# Patient Record
Sex: Male | Born: 1965 | Race: White | Hispanic: No | Marital: Single | Smoking: Current every day smoker
Health system: Southern US, Community
[De-identification: ages and names within clinical notes are randomized; demographics above are authoritative.]

## PROBLEM LIST (undated history)

## (undated) DIAGNOSIS — E119 Type 2 diabetes mellitus without complications: Secondary | ICD-10-CM

## (undated) DIAGNOSIS — H548 Legal blindness, as defined in USA: Secondary | ICD-10-CM

## (undated) DIAGNOSIS — I10 Essential (primary) hypertension: Secondary | ICD-10-CM

---

## 2004-01-10 ENCOUNTER — Observation Stay (HOSPITAL_COMMUNITY): Admission: AC | Admit: 2004-01-10 | Discharge: 2004-01-11 | Payer: Self-pay

## 2004-01-10 ENCOUNTER — Encounter (INDEPENDENT_AMBULATORY_CARE_PROVIDER_SITE_OTHER): Payer: Self-pay | Admitting: *Deleted

## 2007-04-24 ENCOUNTER — Emergency Department (HOSPITAL_COMMUNITY): Admission: EM | Admit: 2007-04-24 | Discharge: 2007-04-24 | Payer: Self-pay | Admitting: Emergency Medicine

## 2013-01-10 ENCOUNTER — Emergency Department (HOSPITAL_COMMUNITY)
Admission: EM | Admit: 2013-01-10 | Discharge: 2013-01-10 | Disposition: A | Discharge status: Home / Self Care | Payer: Medicare Other | Source: Home / Self Care | Attending: Family Medicine | Admitting: Family Medicine

## 2013-01-10 ENCOUNTER — Encounter (HOSPITAL_COMMUNITY): Payer: Self-pay | Admitting: Emergency Medicine

## 2013-01-10 DIAGNOSIS — H109 Unspecified conjunctivitis: Secondary | ICD-10-CM

## 2013-01-10 DIAGNOSIS — I10 Essential (primary) hypertension: Secondary | ICD-10-CM

## 2013-01-10 DIAGNOSIS — J309 Allergic rhinitis, unspecified: Secondary | ICD-10-CM

## 2013-01-10 HISTORY — DX: Essential (primary) hypertension: I10

## 2013-01-10 HISTORY — DX: Legal blindness, as defined in USA: H54.8

## 2013-01-10 MED ORDER — ERYTHROMYCIN 5 MG/GM OP OINT: TOPICAL_OINTMENT | OPHTHALMIC | Status: AC

## 2013-01-10 MED ORDER — FLUTICASONE PROPIONATE 50 MCG/ACT NA SUSP: 2.0000 | Freq: Every day | NASAL | Status: AC

## 2013-01-10 MED ORDER — AZELASTINE HCL 0.05 % OP SOLN: 1.0000 [drp] | Freq: Two times a day (BID) | OPHTHALMIC | Status: AC

## 2013-01-10 MED ORDER — FEXOFENADINE HCL 180 MG PO TABS: 180.0000 mg | ORAL_TABLET | Freq: Every day | ORAL | Status: AC | PRN

## 2013-01-10 MED ORDER — IBUPROFEN 600 MG PO TABS: 600.0000 mg | ORAL_TABLET | Freq: Three times a day (TID) | ORAL | Status: DC | PRN

## 2013-01-10 MED ORDER — TRIAMTERENE-HCTZ 37.5-25 MG PO TABS: 1.0000 | ORAL_TABLET | Freq: Every day | ORAL | Status: AC

## 2013-01-10 MED ORDER — GUAIFENESIN-CODEINE 100-10 MG/5ML PO SYRP: 5.0000 mL | ORAL_SOLUTION | Freq: Three times a day (TID) | ORAL | Status: AC | PRN

## 2013-01-10 NOTE — ED Provider Notes (Signed)
History     CSN: [id number]  Arrival date & time 01/10/13  1010   First MD Initiated Contact with Patient 01/10/13 1103      Chief Complaint  Patient presents with  . URI    (Consider location/radiation/quality/duration/timing/severity/associated sxs/prior treatment) HPI Comments: 47 year old male legally blind also with history of hypertension. Here complaining of nasal congestion, right ear discomfort, watery eyes, clear rhinorrhea and sore throat for 4 days. No fever or chills but reports general malaise. Eyes were initially itching and patient has been dropping recurrently but now there is burning sensation and white drainage bilaterally. Patient is a smoker and denies cough, shortness of breath, chest pain or wheezing.   Past Medical History  Diagnosis Date  . Hypertension   . Legally blind     History reviewed. No pertinent past surgical history.  History reviewed. No pertinent family history.  History  Substance Use Topics  . Smoking status: Current Every Day Smoker  . Smokeless tobacco: Not on file  . Alcohol Use: Yes      Review of Systems  Constitutional: Negative for fever, chills, diaphoresis, appetite change and fatigue.  HENT: Positive for congestion, sore throat, rhinorrhea, sneezing and postnasal drip. Negative for trouble swallowing, sinus pressure and ear discharge.   Eyes: Positive for pain, discharge, redness and itching.  Respiratory: Negative for cough, chest tightness, shortness of breath and wheezing.   Cardiovascular: Negative for chest pain.  Gastrointestinal: Negative for nausea, vomiting, abdominal pain and diarrhea.  Genitourinary: Negative for dysuria.  Skin: Negative for rash.  Allergic/Immunologic: Positive for environmental allergies.  Neurological: Negative for dizziness and headaches.  All other systems reviewed and are negative.    Allergies  Review of patient's allergies indicates not on file.  Home Medications   Current  Outpatient Rx  Name  Route  Sig  Dispense  Refill  . OVER THE COUNTER MEDICATION      Patient has been off medicine for 6 months, ran out         . azelastine (OPTIVAR) 0.05 % ophthalmic solution   Both Eyes   Place 1 drop into both eyes 2 (two) times daily.   6 mL   0   . erythromycin ophthalmic ointment      Place a 1/2 inch ribbon of ointment into the lower conjunctiva bilateral tid for 7 days.   3.5 g   0   . fexofenadine (ALLEGRA) 180 MG tablet   Oral   Take 1 tablet (180 mg total) by mouth daily as needed.   30 tablet   0   . fluticasone (FLONASE) 50 MCG/ACT nasal spray   Nasal   Place 2 sprays into the nose daily.   16 g   0   . guaiFENesin-codeine (ROBITUSSIN AC) 100-10 MG/5ML syrup   Oral   Take 5 mLs by mouth 3 (three) times daily as needed for cough.   120 mL   0   . ibuprofen (ADVIL,MOTRIN) 600 MG tablet   Oral   Take 1 tablet (600 mg total) by mouth every 8 (eight) hours as needed for pain or fever.   20 tablet   0   . triamterene-hydrochlorothiazide (MAXZIDE-25) 37.5-25 MG per tablet   Oral   Take 1 tablet by mouth daily.   30 tablet   1     BP 143/103  Pulse 82  Temp(Src) 98.2 F (36.8 C) (Oral)  Resp 18  SpO2 96%  Physical Exam  Nursing note and vitals  reviewed. Constitutional: He is oriented to person, place, and time. He appears well-developed and well-nourished. No distress.  HENT:  Head: Normocephalic and atraumatic.  Right Ear: External ear normal.  Left Ear: External ear normal.  Mouth/Throat: No oropharyngeal exudate.  Nasal Congestion with erythema and swelling of nasal turbinates, clear rhinorrhea. Significant pharyngeal erythema no exudates. No uvula deviation. No trismus. TM's with increased vascular markings and some dullness bilaterally no swelling or bulging  Eyes: EOM are normal. Pupils are equal, round, and reactive to light. Right eye exhibits discharge. Left eye exhibits discharge. No scleral icterus.   Conjunctival erythema with white/yellow exudates and mild blepharitis bilaterally. No periorbital erythema, induration, tenderness or edema.  Neck: Neck supple.  Cardiovascular: Normal rate, normal heart sounds and intact distal pulses.   Pulmonary/Chest: Effort normal and breath sounds normal. No respiratory distress. He has no wheezes. He has no rales. He exhibits no tenderness.  Abdominal: Soft. There is no tenderness.  Lymphadenopathy:    He has no cervical adenopathy.  Neurological: He is alert and oriented to person, place, and time.  Skin: No rash noted. He is not diaphoretic.    ED Course  Procedures (including critical care time)  Labs Reviewed  POCT RAPID STREP A ([facility] ONLY)   No results found.   1. Conjunctivitis   2. Rhinitis, allergic   3. Hypertension       MDM  #1 impress allergic conjunctivitis  Initially now with signs of over infection.treated with erythromycin ointment and Optivar ophthalmic. #2 impress allergic rhinitis possible viral upper respiratory concomitant infection, treated with Flonase, fexofenadine, ibuprofen, guaifenesin/codeine. #3 contacted patient's pharmacy to confirm hydrochlorothiazide dose decided to prescribe Maxzide. Patient declined baseline blood work today. Patient was given contact information for [facility] to establish primary care. Supportive care and red flags should prompt his return to medical attention discussed with patient and his caretaker and provided in writing.        Sharin Grave, MD 01/11/13 [phone]

## 2013-01-10 NOTE — ED Notes (Signed)
Multiple concerns: dont feel good, sore throat, eyes per information paper

## 2014-10-20 ENCOUNTER — Encounter (HOSPITAL_COMMUNITY): Payer: Self-pay | Admitting: Emergency Medicine

## 2014-10-20 ENCOUNTER — Emergency Department (HOSPITAL_COMMUNITY)
Admission: EM | Admit: 2014-10-20 | Discharge: 2014-10-20 | Disposition: A | Discharge status: Home / Self Care | Payer: 59 | Source: Home / Self Care | Attending: Family Medicine | Admitting: Family Medicine

## 2014-10-20 DIAGNOSIS — L729 Follicular cyst of the skin and subcutaneous tissue, unspecified: Secondary | ICD-10-CM

## 2014-10-20 NOTE — ED Provider Notes (Signed)
CSN: [id number]     Arrival date & time 10/20/14  1147 History   First MD Initiated Contact with Patient 10/20/14 1227     No chief complaint on file.  (Consider location/radiation/quality/duration/timing/severity/associated sxs/prior Treatment) HPI        49 year old male presents for evaluation of a skin cyst. It has been there for about 3 years on the back of his neck below the hairline. It has changed or gotten much larger in the past 3 weeks. He denies any other symptoms. There is no pain associated with this. He does not have a primary care doctor.  Past Medical History  Diagnosis Date  . Hypertension   . Legally blind    No past surgical history on file. No family history on file. History  Substance Use Topics  . Smoking status: Current Every Day Smoker  . Smokeless tobacco: Not on file  . Alcohol Use: Yes    Review of Systems  Skin:       Cyst on back of neck, see history of present illness  All other systems reviewed and are negative.   Allergies  Review of patient's allergies indicates not on file.  Home Medications   Prior to Admission medications   Medication Sig Start Date End Date Taking? Authorizing Provider  azelastine (OPTIVAR) 0.05 % ophthalmic solution Place 1 drop into both eyes 2 (two) times daily. 01/10/13   Adlih Moreno-Coll, MD  erythromycin ophthalmic ointment Place a 1/2 inch ribbon of ointment into the lower conjunctiva bilateral tid for 7 days. 01/10/13   Adlih Moreno-Coll, MD  fexofenadine (ALLEGRA) 180 MG tablet Take 1 tablet (180 mg total) by mouth daily as needed. 01/10/13   Adlih Moreno-Coll, MD  fluticasone (FLONASE) 50 MCG/ACT nasal spray Place 2 sprays into the nose daily. 01/10/13   Adlih Moreno-Coll, MD  guaiFENesin-codeine (ROBITUSSIN AC) 100-10 MG/5ML syrup Take 5 mLs by mouth 3 (three) times daily as needed for cough. 01/10/13   Adlih Moreno-Coll, MD  ibuprofen (ADVIL,MOTRIN) 600 MG tablet Take 1 tablet (600 mg total) by mouth every 8  (eight) hours as needed for pain or fever. 01/10/13   Sharin GraveAdlih Moreno-Coll, MD  OVER THE COUNTER MEDICATION Patient has been off medicine for 6 months, ran out    Historical Provider, MD  triamterene-hydrochlorothiazide (MAXZIDE-25) 37.5-25 MG per tablet Take 1 tablet by mouth daily. 01/10/13   Adlih Moreno-Coll, MD   BP 136/84 mmHg  Pulse 61  Temp(Src) 98.4 F (36.9 C) (Oral)  Resp 16  SpO2 100% Physical Exam  Constitutional: He is oriented to person, place, and time. He appears well-developed and well-nourished. No distress.  HENT:  Head: Normocephalic.  Pulmonary/Chest: Effort normal. No respiratory distress.  Neurological: He is alert and oriented to person, place, and time. Coordination normal.  Skin: Skin is warm and dry. No rash noted. He is not diaphoretic.  On the back of the neck, there is a 4 cm diameter indurated nontender lesion, mobile  Psychiatric: He has a normal mood and affect. Judgment normal.  Nursing note and vitals reviewed.   ED Course  Procedures (including critical care time) Labs Review Labs Reviewed - No data to display  Imaging Review No results found.   MDM   1. Skin cyst    He does not have a primary care doctor, he does have health insurance. He is referred to general surgery for evaluation and management    Graylon GoodZachary H Jomes Giraldo, PA-C 10/20/14 1241

## 2014-10-20 NOTE — ED Notes (Signed)
Pt states that he has a mass/lump on neck for 2 weeks.

## 2014-10-20 NOTE — Discharge Instructions (Signed)
Epidermal Cyst An epidermal cyst is sometimes called a sebaceous cyst, epidermal inclusion cyst, or infundibular cyst. These cysts usually contain a substance that looks "pasty" or "cheesy" and may have a bad smell. This substance is a protein called keratin. Epidermal cysts are usually found on the face, neck, or trunk. They may also occur in the vaginal area or other parts of the genitalia of both men and women. Epidermal cysts are usually small, painless, slow-growing bumps or lumps that move freely under the skin. It is important not to try to pop them. This may cause an infection and lead to tenderness and swelling. CAUSES  Epidermal cysts may be caused by a deep penetrating injury to the skin or a plugged hair follicle, often associated with acne. SYMPTOMS  Epidermal cysts can become inflamed and cause:  Redness.  Tenderness.  Increased temperature of the skin over the bumps or lumps.  Grayish-white, bad smelling material that drains from the bump or lump. DIAGNOSIS  Epidermal cysts are easily diagnosed by your caregiver during an exam. Rarely, a tissue sample (biopsy) may be taken to rule out other conditions that may resemble epidermal cysts. TREATMENT   Epidermal cysts often get better and disappear on their own. They are rarely ever cancerous.  If a cyst becomes infected, it may become inflamed and tender. This may require opening and draining the cyst. Treatment with antibiotics may be necessary. When the infection is gone, the cyst may be removed with minor surgery.  Small, inflamed cysts can often be treated with antibiotics or by injecting steroid medicines.  Sometimes, epidermal cysts become large and bothersome. If this happens, surgical removal in your caregiver's office may be necessary. HOME CARE INSTRUCTIONS  Only take over-the-counter or prescription medicines as directed by your caregiver.  Take your antibiotics as directed. Finish them even if you start to feel  better. SEEK MEDICAL CARE IF:   Your cyst becomes tender, red, or swollen.  Your condition is not improving or is getting worse.  You have any other questions or concerns. MAKE SURE YOU:  Understand these instructions.  Will watch your condition.  Will get help right away if you are not doing well or get worse. Document Released: 07/17/2004 Document Revised: 11/08/2011 Document Reviewed: 02/22/2011 Exit[employer] Patient Information 2015 [employer]. This information is not intended to replace advice given to you by your health care provider. Make sure you discuss any questions you have with your health care provider.  

## 2015-08-07 ENCOUNTER — Ambulatory Visit (INDEPENDENT_AMBULATORY_CARE_PROVIDER_SITE_OTHER): Payer: Self-pay | Admitting: Emergency Medicine

## 2015-08-07 ENCOUNTER — Emergency Department (HOSPITAL_COMMUNITY)
Admission: EM | Admit: 2015-08-07 | Discharge: 2015-08-07 | Disposition: A | Discharge status: Home / Self Care | Payer: Medicare Other | Attending: Emergency Medicine | Admitting: Emergency Medicine

## 2015-08-07 ENCOUNTER — Emergency Department (HOSPITAL_COMMUNITY): Payer: Medicare Other

## 2015-08-07 VITALS — BP 192/92 | HR 72 | Temp 98.0°F | Resp 16 | Ht 74.5 in | Wt 223.2 lb

## 2015-08-07 DIAGNOSIS — R079 Chest pain, unspecified: Secondary | ICD-10-CM | POA: Diagnosis not present

## 2015-08-07 DIAGNOSIS — H548 Legal blindness, as defined in USA: Secondary | ICD-10-CM | POA: Diagnosis not present

## 2015-08-07 DIAGNOSIS — E119 Type 2 diabetes mellitus without complications: Secondary | ICD-10-CM

## 2015-08-07 DIAGNOSIS — F17200 Nicotine dependence, unspecified, uncomplicated: Secondary | ICD-10-CM | POA: Diagnosis not present

## 2015-08-07 DIAGNOSIS — Z791 Long term (current) use of non-steroidal anti-inflammatories (NSAID): Secondary | ICD-10-CM | POA: Diagnosis not present

## 2015-08-07 DIAGNOSIS — I10 Essential (primary) hypertension: Secondary | ICD-10-CM | POA: Diagnosis not present

## 2015-08-07 DIAGNOSIS — R0789 Other chest pain: Secondary | ICD-10-CM | POA: Insufficient documentation

## 2015-08-07 DIAGNOSIS — F17210 Nicotine dependence, cigarettes, uncomplicated: Secondary | ICD-10-CM | POA: Diagnosis not present

## 2015-08-07 LAB — BASIC METABOLIC PANEL
ANION GAP: 6 (ref 5–15)
BUN: 18 mg/dL (ref 6–20)
CALCIUM: 8.9 mg/dL (ref 8.9–10.3)
CO2: 25 mmol/L (ref 22–32)
CREATININE: 1.01 mg/dL (ref 0.61–1.24)
Chloride: 106 mmol/L (ref 101–111)
GFR calc Af Amer: >60 mL/min (ref >60)
GFR calc non Af Amer: >60 mL/min (ref >60)
GLUCOSE: 126 mg/dL — AB (ref 65–99)
Potassium: 4.6 mmol/L (ref 3.5–5.1)
Sodium: 137 mmol/L (ref 135–145)

## 2015-08-07 LAB — CBC
HEMATOCRIT: 40.2 % (ref 39.0–52.0)
Hemoglobin: 13.6 g/dL (ref 13.0–17.0)
MCH: 30.8 pg (ref 26.0–34.0)
MCHC: 33.8 g/dL (ref 30.0–36.0)
MCV: 91 fL (ref 78.0–100.0)
Platelets: 224 10*3/uL (ref 150–400)
RBC: 4.42 MIL/uL (ref 4.22–5.81)
RDW: 13.3 % (ref 11.5–15.5)
WBC: 12.7 10*3/uL — ABNORMAL HIGH (ref 4.0–10.5)

## 2015-08-07 LAB — I-STAT TROPONIN, ED: Troponin i, poc: 0 ng/mL (ref 0.00–0.08)

## 2015-08-07 MED ORDER — NITROGLYCERIN 0.3 MG SL SUBL: 0.3000 mg | SUBLINGUAL_TABLET | SUBLINGUAL | Status: AC | PRN

## 2015-08-07 MED ORDER — IBUPROFEN 800 MG PO TABS
800.0000 mg | ORAL_TABLET | Freq: Once | ORAL | Status: AC
  Administered 2015-08-07: 800 mg via ORAL
  Filled 2015-08-07: qty 1

## 2015-08-07 MED ORDER — IBUPROFEN 800 MG PO TABS: 800.0000 mg | ORAL_TABLET | Freq: Three times a day (TID) | ORAL | Status: AC

## 2015-08-07 MED ORDER — SODIUM CHLORIDE 0.9 % IV SOLN
1000.0000 mL | INTRAVENOUS | Status: DC
  Administered 2015-08-07: 1000 mL via INTRAVENOUS

## 2015-08-07 MED ORDER — ASPIRIN 325 MG PO TABS
325.0000 mg | ORAL_TABLET | Freq: Every day | ORAL | Status: AC
  Administered 2015-08-07: 325 mg via ORAL

## 2015-08-07 MED ORDER — SODIUM CHLORIDE 0.9 % IV SOLN
1000.0000 mL | Freq: Once | INTRAVENOUS | Status: AC
  Administered 2015-08-07: 1000 mL via INTRAVENOUS

## 2015-08-07 NOTE — ED Notes (Signed)
Per EMS Patient went to [facility] urgent care with C/O Chest pain. Chest pain haas been ongoing for 2 weeks. No nausea, vomiting, dyspnea. Patient is legally blind.  He has not taken any of his medications for 6 months.  Given 325 mg ASA and 1 NTG at urgent care, 1 NTG by EMS.

## 2015-08-07 NOTE — Progress Notes (Signed)
Subjective:  Patient ID: John Harrington, male    DOB: 1966-03-08  Age: 49 y.o. MRN: [id number]  CC: Chest Pain and Hypertension   HPI John Harrington presents   With two-week intermittent chest pain. He says his chest pain lasts indeterminate periods time during the day over the last 2 weeks spontaneously resolves. Is not associated with any nausea vomiting or stool change. Has no cough or coryza or shortness of breath and wheezing.  He has no history of injury or overuse. Says the pain spontaneously comes on and last for about an hour several hours and then goes away with no factor relieving it. His history is significant for smoking high blood pressure and  Noncompliance with treatment. She has no history of travel or immobilization said makes pulmonary embolus less likely.  History John Harrington has a past medical history of Hypertension and Legally blind.   He has no past surgical history on file.   His  family history is not on file.  He   reports that he has been smoking Cigarettes.  He has been smoking about 1.00 pack per day. He does not have any smokeless tobacco history on file. He reports that he drinks alcohol. His drug history is not on file.  No facility-administered medications prior to visit.   Outpatient Prescriptions Prior to Visit  Medication Sig Dispense Refill  . azelastine (OPTIVAR) 0.05 % ophthalmic solution Place 1 drop into both eyes 2 (two) times daily. (Patient not taking: Reported on 08/07/2015) 6 mL 0  . erythromycin ophthalmic ointment Place a 1/2 inch ribbon of ointment into the lower conjunctiva bilateral tid for 7 days. (Patient not taking: Reported on 08/07/2015) 3.5 g 0  . fexofenadine (ALLEGRA) 180 MG tablet Take 1 tablet (180 mg total) by mouth daily as needed. (Patient not taking: Reported on 08/07/2015) 30 tablet 0  . fluticasone (FLONASE) 50 MCG/ACT nasal spray Place 2 sprays into the nose daily. (Patient not taking: Reported on 08/07/2015) 16 g 0  .  guaiFENesin-codeine (ROBITUSSIN AC) 100-10 MG/5ML syrup Take 5 mLs by mouth 3 (three) times daily as needed for cough. (Patient not taking: Reported on 08/07/2015) 120 mL 0  . ibuprofen (ADVIL,MOTRIN) 600 MG tablet Take 1 tablet (600 mg total) by mouth every 8 (eight) hours as needed for pain or fever. (Patient not taking: Reported on 08/07/2015) 20 tablet 0  . OVER THE COUNTER MEDICATION Patient has been off medicine for 6 months, ran out    . triamterene-hydrochlorothiazide (MAXZIDE-25) 37.5-25 MG per tablet Take 1 tablet by mouth daily. (Patient not taking: Reported on 08/07/2015) 30 tablet 1    Social History   Social History  . Marital Status: Single    Spouse Name: N/A  . Number of Children: N/A  . Years of Education: N/A   Social History Main Topics  . Smoking status: Current Every Day Smoker -- 1.00 packs/day    Types: Cigarettes  . Smokeless tobacco: None  . Alcohol Use: Yes  . Drug Use: None  . Sexual Activity: Not Asked   Other Topics Concern  . None   Social History Narrative     Review of Systems  Constitutional: Positive for fatigue. Negative for fever, chills and appetite change.  HENT: Negative for congestion, ear pain, postnasal drip, sinus pressure and sore throat.   Eyes: Negative for pain and redness.  Respiratory: Positive for chest tightness (intermittently over past two weeks. pain now since this morning.) and shortness of breath. Negative for cough  and wheezing.   Cardiovascular: Positive for chest pain. Negative for palpitations and leg swelling.  Gastrointestinal: Negative for nausea, vomiting, abdominal pain, diarrhea, constipation and blood in stool.  Endocrine: Negative for polyuria.  Genitourinary: Negative for dysuria, urgency, frequency and flank pain.  Musculoskeletal: Negative for gait problem.  Skin: Negative for rash.  Neurological: Negative for weakness and headaches.  Psychiatric/Behavioral: Negative for confusion and decreased  concentration. The patient is not nervous/anxious.     Objective:  BP 192/92 mmHg  Pulse 72  Temp(Src) 98 F (36.7 C) (Oral)  Resp 16  Ht 6' 2.5" (1.892 m)  Wt 223 lb 3.2 oz (101.243 kg)  BMI 28.28 kg/m2  SpO2 98%  Physical Exam  Constitutional: He is oriented to person, place, and time. He appears well-developed and well-nourished. No distress.  HENT:  Head: Normocephalic and atraumatic.  Right Ear: External ear normal.  Left Ear: External ear normal.  Nose: Nose normal.  Eyes: Conjunctivae and EOM are normal. Pupils are equal, round, and reactive to light. No scleral icterus.  Neck: Normal range of motion. Neck supple. No tracheal deviation present.  Cardiovascular: Normal rate, regular rhythm and normal heart sounds.   Pulmonary/Chest: Effort normal. No respiratory distress. He has no wheezes. He has no rales.  Abdominal: He exhibits no mass. There is no tenderness. There is no rebound and no guarding.  Musculoskeletal: He exhibits no edema.  Lymphadenopathy:    He has no cervical adenopathy.  Neurological: He is alert and oriented to person, place, and time. Coordination normal.  Skin: Skin is warm and dry. No rash noted.  Psychiatric: He has a normal mood and affect. His behavior is normal.      Assessment & Plan:   [name] was seen today for chest pain and hypertension.  Diagnoses and all orders for this visit:  Chest pain, unspecified chest pain type -     EKG 12-Lead -     aspirin tablet 325 mg; Take 1 tablet (325 mg total) by mouth daily. -     nitroGLYCERIN (NITROSTAT) SL tablet 0.3 mg; Place 1 tablet (0.3 mg total) under the tongue every 5 (five) minutes as needed for chest pain.  Essential hypertension -     EKG 12-Lead -     aspirin tablet 325 mg; Take 1 tablet (325 mg total) by mouth daily. -     nitroGLYCERIN (NITROSTAT) SL tablet 0.3 mg; Place 1 tablet (0.3 mg total) under the tongue every 5 (five) minutes as needed for chest pain.  Essential  hypertension, benign -     aspirin tablet 325 mg; Take 1 tablet (325 mg total) by mouth daily. -     nitroGLYCERIN (NITROSTAT) SL tablet 0.3 mg; Place 1 tablet (0.3 mg total) under the tongue every 5 (five) minutes as needed for chest pain.  Diabetes mellitus without complication (HCC) -     aspirin tablet 325 mg; Take 1 tablet (325 mg total) by mouth daily. -     nitroGLYCERIN (NITROSTAT) SL tablet 0.3 mg; Place 1 tablet (0.3 mg total) under the tongue every 5 (five) minutes as needed for chest pain.  Tobacco use disorder -     aspirin tablet 325 mg; Take 1 tablet (325 mg total) by mouth daily. -     nitroGLYCERIN (NITROSTAT) SL tablet 0.3 mg; Place 1 tablet (0.3 mg total) under the tongue every 5 (five) minutes as needed for chest pain.  TO ER via EMS.  NTG, ASA and O2.  I am having Mr. Stohr maintain his erythromycin, azelastine, fexofenadine, guaiFENesin-codeine, ibuprofen, triamterene-hydrochlorothiazide, OVER THE COUNTER MEDICATION, and fluticasone. We administered aspirin. We will continue to administer aspirin and nitroGLYCERIN.  Meds ordered this encounter  Medications  . aspirin tablet 325 mg    Sig:   . nitroGLYCERIN (NITROSTAT) SL tablet 0.3 mg    Sig:     Appropriate red flag conditions were discussed with the patient as well as actions that should be taken.  Patient expressed his understanding.  Follow-up: Return if symptoms worsen or fail to improve.  Carmelina Dane, MD

## 2015-08-07 NOTE — Patient Instructions (Signed)
Nonspecific Chest Pain  °Chest pain can be caused by many different conditions. There is always a chance that your pain could be related to something serious, such as a heart attack or a blood clot in your lungs. Chest pain can also be caused by conditions that are not life-threatening. If you have chest pain, it is very important to follow up with your health care provider. °CAUSES  °Chest pain can be caused by: °· Heartburn. °· Pneumonia or bronchitis. °· Anxiety or stress. °· Inflammation around your heart (pericarditis) or lung (pleuritis or pleurisy). °· A blood clot in your lung. °· A collapsed lung (pneumothorax). It can develop suddenly on its own (spontaneous pneumothorax) or from trauma to the chest. °· Shingles infection (varicella-zoster virus). °· Heart attack. °· Damage to the bones, muscles, and cartilage that make up your chest wall. This can include: °¨ Bruised bones due to injury. °¨ Strained muscles or cartilage due to frequent or repeated coughing or overwork. °¨ Fracture to one or more ribs. °¨ Sore cartilage due to inflammation (costochondritis). °RISK FACTORS  °Risk factors for chest pain may include: °· Activities that increase your risk for trauma or injury to your chest. °· Respiratory infections or conditions that cause frequent coughing. °· Medical conditions or overeating that can cause heartburn. °· Heart disease or family history of heart disease. °· Conditions or health behaviors that increase your risk of developing a blood clot. °· Having had chicken pox (varicella zoster). °SIGNS AND SYMPTOMS °Chest pain can feel like: °· Burning or tingling on the surface of your chest or deep in your chest. °· Crushing, pressure, aching, or squeezing pain. °· Dull or sharp pain that is worse when you move, cough, or take a deep breath. °· Pain that is also felt in your back, neck, shoulder, or arm, or pain that spreads to any of these areas. °Your chest pain may come and go, or it may stay  constant. °DIAGNOSIS °Lab tests or other studies may be needed to find the cause of your pain. Your health care provider may have you take a test called an ambulatory ECG (electrocardiogram). An ECG records your heartbeat patterns at the time the test is performed. You may also have other tests, such as: °· Transthoracic echocardiogram (TTE). During echocardiography, sound waves are used to create a picture of all of the heart structures and to look at how blood flows through your heart. °· Transesophageal echocardiogram (TEE). This is a more advanced imaging test that obtains images from inside your body. It allows your health care provider to see your heart in finer detail. °· Cardiac monitoring. This allows your health care provider to monitor your heart rate and rhythm in real time. °· Holter monitor. This is a portable device that records your heartbeat and can help to diagnose abnormal heartbeats. It allows your health care provider to track your heart activity for several days, if needed. °· Stress tests. These can be done through exercise or by taking medicine that makes your heart beat more quickly. °· Blood tests. °· Imaging tests. °TREATMENT  °Your treatment depends on what is causing your chest pain. Treatment may include: °· Medicines. These may include: °¨ Acid blockers for heartburn. °¨ Anti-inflammatory medicine. °¨ Pain medicine for inflammatory conditions. °¨ Antibiotic medicine, if an infection is present. °¨ Medicines to dissolve blood clots. °¨ Medicines to treat coronary artery disease. °· Supportive care for conditions that do not require medicines. This may include: °¨ Resting. °¨ Applying heat   or cold packs to injured areas. °¨ Limiting activities until pain decreases. °HOME CARE INSTRUCTIONS °· If you were prescribed an antibiotic medicine, finish it all even if you start to feel better. °· Avoid any activities that bring on chest pain. °· Do not use any tobacco products, including  cigarettes, chewing tobacco, or electronic cigarettes. If you need help quitting, ask your health care provider. °· Do not drink alcohol. °· Take medicines only as directed by your health care provider. °· Keep all follow-up visits as directed by your health care provider. This is important. This includes any further testing if your chest pain does not go away. °· If heartburn is the cause for your chest pain, you may be told to keep your head raised (elevated) while sleeping. This reduces the chance that acid will go from your stomach into your esophagus. °· Make lifestyle changes as directed by your health care provider. These may include: °¨ Getting regular exercise. Ask your health care provider to suggest some activities that are safe for you. °¨ Eating a heart-healthy diet. A registered dietitian can help you to learn healthy eating options. °¨ Maintaining a healthy weight. °¨ Managing diabetes, if necessary. °¨ Reducing stress. °SEEK MEDICAL CARE IF: °· Your chest pain does not go away after treatment. °· You have a rash with blisters on your chest. °· You have a fever. °SEEK IMMEDIATE MEDICAL CARE IF:  °· Your chest pain is worse. °· You have an increasing cough, or you cough up blood. °· You have severe abdominal pain. °· You have severe weakness. °· You faint. °· You have chills. °· You have sudden, unexplained chest discomfort. °· You have sudden, unexplained discomfort in your arms, back, neck, or jaw. °· You have shortness of breath at any time. °· You suddenly start to sweat, or your skin gets clammy. °· You feel nauseous or you vomit. °· You suddenly feel light-headed or dizzy. °· Your heart begins to beat quickly, or it feels like it is skipping beats. °These symptoms may represent a serious problem that is an emergency. Do not wait to see if the symptoms will go away. Get medical help right away. Call your local emergency services (911 in the [place] Do not drive yourself to the hospital. °  °This  information is not intended to replace advice given to you by your health care provider. Make sure you discuss any questions you have with your health care provider. °  °Document Released: 05/26/2005 Document Revised: 09/06/2014 Document Reviewed: 03/22/2014 °Elsevier Interactive Patient Education ©2016 Elsevier Inc. ° °

## 2015-08-07 NOTE — Discharge Instructions (Signed)
Nonspecific Chest Pain °It is often hard to find the cause of chest pain. There is always a chance that your pain could be related to something serious, such as a heart attack or a blood clot in your lungs. Chest pain can also be caused by conditions that are not life-threatening. If you have chest pain, it is very important to follow up with your doctor. ° °HOME CARE °· If you were prescribed an antibiotic medicine, finish it all even if you start to feel better. °· Avoid any activities that cause chest pain. °· Do not use any tobacco products, including cigarettes, chewing tobacco, or electronic cigarettes. If you need help quitting, ask your doctor. °· Do not drink alcohol. °· Take medicines only as told by your doctor. °· Keep all follow-up visits as told by your doctor. This is important. This includes any further testing if your chest pain does not go away. °· Your doctor may tell you to keep your head raised (elevated) while you sleep. °· Make lifestyle changes as told by your doctor. These may include: °¨ Getting regular exercise. Ask your doctor to suggest some activities that are safe for you. °¨ Eating a heart-healthy diet. Your doctor or a diet specialist (dietitian) can help you to learn healthy eating options. °¨ Maintaining a healthy weight. °¨ Managing diabetes, if necessary. °¨ Reducing stress. °GET HELP IF: °· Your chest pain does not go away, even after treatment. °· You have a rash with blisters on your chest. °· You have a fever. °GET HELP RIGHT AWAY IF: °· Your chest pain is worse. °· You have an increasing cough, or you cough up blood. °· You have severe belly (abdominal) pain. °· You feel extremely weak. °· You pass out (faint). °· You have chills. °· You have sudden, unexplained chest discomfort. °· You have sudden, unexplained discomfort in your arms, back, neck, or jaw. °· You have shortness of breath at any time. °· You suddenly start to sweat, or your skin gets clammy. °· You feel  nauseous. °· You vomit. °· You suddenly feel light-headed or dizzy. °· Your heart begins to beat quickly, or it feels like it is skipping beats. °These symptoms may be an emergency. Do not wait to see if the symptoms will go away. Get medical help right away. Call your local emergency services (911 in the [place] Do not drive yourself to the hospital. °  °This information is not intended to replace advice given to you by your health care provider. Make sure you discuss any questions you have with your health care provider. °  °Document Released: 02/02/2008 Document Revised: 09/06/2014 Document Reviewed: 03/22/2014 °Elsevier Interactive Patient Education ©2016 Elsevier Inc. ° ° °Emergency Department Resource Guide °1) Find a Doctor and Pay Out of Pocket °Although you won't have to find out who is covered by your insurance plan, it is a good idea to ask around and get recommendations. You will then need to call the office and see if the doctor you have chosen will accept you as a new patient and what types of options they offer for patients who are self-pay. Some doctors offer discounts or will set up payment plans for their patients who do not have insurance, but you will need to ask so you aren't surprised when you get to your appointment. ° °2) Contact Your Local Health Department °Not all health departments have doctors that can see patients for sick visits, but many do, so it is worth a   call to see if yours does. If you don't know where your local health department is, you can check in your phone book. The CDC also has a tool to help you locate your state's health department, and many state websites also have listings of all of their local health departments. ° °3) Find a Walk-in Clinic °If your illness is not likely to be very severe or complicated, you may want to try a walk in clinic. These are popping up all over the country in pharmacies, drugstores, and shopping centers. They're usually staffed by nurse  practitioners or physician assistants that have been trained to treat common illnesses and complaints. They're usually fairly quick and inexpensive. However, if you have serious medical issues or chronic medical problems, these are probably not your best option. ° °No Primary Care Doctor: °- Call [facility] at  832-8000 - they can help you locate a primary care doctor that  accepts your insurance, provides certain services, etc. °- Physician Referral Service- 1-800-533-3463 ° °Chronic Pain Problems: °Organization         Address  Phone   Notes  °[facility]  (336) [phone] Patients need to be referred by their primary care doctor.  ° °Medication Assistance: °Organization         Address  Phone   Notes  °[place] [facility] [address] °[place], [place] [address] (336) 641-8030 --Must be a resident of [place] °-- Must have NO insurance coverage whatsoever (no Medicaid/ Medicare, etc.) °-- The pt. MUST have a primary care doctor that directs their care regularly and follows them in the community °  °[facility]  (866) 331-1348   °[employer]  (888) 892-1162   ° °Agencies that provide inexpensive medical care: °Organization         Address  Phone   Notes  °[facility]  (336) 832-8035   °[facility]    (336) 832-7272   °[facility] [address] °[place], [place] [address] (336) 832-4777   °[facility] [address], °[place] (336) 271-4999   °[facility]    (336) 373-0678   °[facility]    (336) 272-1050   °[facility] ° [address], [place] Phone:  (336) 832-4444, Fax:  (336) 832-4440 Hours of Operation:  9 am - 6 pm, M-F.  Also accepts Medicaid/Medicare and self-pay.  °[facility] ° [address], [place] Phone: (336) [phone], Fax: (336) [phone]. Hours of Operation:  8:30 am -  5:30 pm, M-F.  Also accepts Medicaid and self-pay.  °[facility] [place] [address], [place] Phone: (336) 878-6027   °[facility] [address], [place], [place] (336)723-1848, Ext. 123 Mondays & Thursdays: 7-9 AM.  First 15 patients are seen on a first come, first serve basis. °  ° °Medicaid-accepting [place] Providers: ° °Organization         Address  Phone   Notes  °[facility] [address], [place] (336) [phone] Also accepts self-pay patients.  °[facility] [address], [place] ° (336) 856-9996   °[facility] [address], [place] (336) 288-8857   °[facility] [address] [place] [address], [place] (336) 299-7000   °Veita Bland [address],   [place]  ° (336) [phone] Only accepts [place] Access Medicaid patients after they have their name applied to their card.  ° °Self-Pay (no insurance) in [place]: ° °Organization         Address  Phone   Notes  °Sickle Cell Patients, [facility] [address], [place] (336) 832-1970   °[facility] [address], [place] (336) 832-4400   ° [facility] ° [address], [place] (336) 992-4800   °[facility]/Dr. Osei-Bonsu ° [address] [place] [address], [place] or [address], [place] (336) [phone] Phone number for both [place] and [place] locations is the same.  °[facility] [address], [place] (336) 299-0000   °[facility] [address] [place] [address], [place] or [address] (336) 852-7530 °(336) 878-2260   °[facility] [address], [place] (336) 350-1642, phone; (336) 294-5005, fax Sees patients 1st and 3rd Saturday of every month.  Must not qualify for public or private insurance (i.e. Medicaid, Medicare, [place] Health Choice,  Veterans' Benefits) • Household income should be no more than 200% of the poverty level •The clinic cannot treat you if you are pregnant or think you are pregnant • Sexually transmitted diseases are not treated at the clinic.  ° ° °Dental Care: °Organization         Address  Phone  Notes  °[facility] [address], [place] (336) 641-6152 Accepts children up to age 21 who are enrolled in Medicaid or [place] Health Choice; pregnant women with a Medicaid card; and children who have applied for Medicaid or [place] Health Choice, but were declined, whose parents can pay a reduced fee at time of service.  °[facility] [place]  [address], [place] (336) 641-7733 Accepts children up to age 21 who are enrolled in Medicaid or [place] Health Choice; pregnant women with a Medicaid card; and children who have applied for Medicaid or [place] Health Choice, but were declined, whose parents can pay a reduced fee at time of service.  °[facility] ° [address], [place] (336) [phone] Patients are seen by appointment only. Walk-ins are not accepted. [facility] will see patients 18 years of age and older. °Monday - Tuesday (8am-5pm) °Most Wednesdays (8:30-5pm) °$30 per visit, cash only  °[facility] ° [address], [place] (336) [phone] Patients are seen by appointment only. Walk-ins are not accepted. [facility] will see patients 18 years of age and older. °One Wednesday Evening (Monthly: Volunteer Based).  $30 per visit, cash only  °[facility]  (919) [phone] for adults; Children under age 4, call Graduate Pediatric Dentistry at ([phone]) [phone]. Children aged 4-14, please call ([phone]) [phone] to request a pediatric application. ° Dental services are provided in all areas of dental care including fillings, crowns and bridges, complete and  partial dentures, implants, gum treatment, root canals, and extractions. Preventive care is also provided. Treatment is provided to both adults and children. °Patients are selected via a lottery and there is often a waiting list. °  °[facility] [address], °[place] ° (336) 763-8833 [email or web address] °  °[facility] [address], [place], [place] (336)723-1848, Ext. 123 Second and Fourth Thursday of each month,   opens at 6:30 AM; Clinic ends at 9 AM.  Patients are seen on a first-come first-served basis, and a limited number are seen during each clinic.  ° °[facility] ° [address], [place], [place] (336) [phone]   Eligibility Requirements °You must have lived in [place], [place], or [place] counties for at least the last three months. °  You cannot be eligible for state or federal sponsored healthcare insurance, including Veterans Administration, Medicaid, or Medicare. °  You generally cannot be eligible for healthcare insurance through your employer.  °  How to apply: °Eligibility screenings are held every Tuesday and Wednesday afternoon from 1:00 pm until 4:00 pm. You do not need an appointment for the interview!  °[facility] [address], [place], [place] 336-631-2330   °[facility]  336-342-8273   °[facility]  336-703-3100   °[facility]  336-570-6415   ° °Behavioral Health Resources in the Community: °Intensive Outpatient Programs °Organization         Address  Phone  Notes  °[facility] [address], [place], [place] 336-878-6098   °[facility] [address], [place], [place] 336-832-9800   °[facility] [address], [place], [place] ° 336-882-2125   °[facility] [address],  °[place], [place] 1-800-853-5163 or 336-641-4981   °Substance Abuse Resources °Organization          Address  Phone  Notes  °[facility]  336-882-2125   °[facility]  336-784-9470   °[facility]  336-285-9073   °[facility]  336-845-3988   °Residential & Outpatient Substance Abuse Program  1-800-659-3381   °Psychological Services °Organization         Address  Phone  Notes  °[facility]  336- 832-9600   °[facility]  336- 378-7881   °[facility] [address], [place] 1-800-853-5163 or 336-641-4981   ° °Mobile Crisis Teams °Organization         Address  Phone  Notes  °[facility], Mobile Crisis Care Unit  1-877-626-1772   °Assertive °[facility] ° [address] [place], [place] 336-834-9664   °Sharon DeEsch [address] °[place] 336-554-5454   ° °Self-Help/Support Groups °Organization         Address  Phone             Notes  °[facility] - variety of support groups  336- 373-1402 Call for more information  °Narcotics Anonymous (NA), [facility] [address], °[place]  2 meetings at this location  ° °Residential Treatment Programs °Organization         Address  Phone  Notes  °[facility] [address],    °[place]  1-866-801-8205   °[facility] ° [address], [place], [place] 704-293-8524   °[facility] [address], [place] 336-845-3988 Admissions: 8am-3pm M-F  °[facility] [address]    °[place], [place] 336-841-1104   °[facility] [address], [place], [place] 336-379-7146   °[facility] [address]  °[place], [place] 336-285-9073   °[facility] - Intensive Outpatient [address], , [place] 336-852-3033   °[facility] [address]  °[place], [place] 1-877-615-2722   or 336-784-9470   °[facility] [address] [place], [place]  336-227-7417 Accepts Medicaid  °[facility] [address]  °[place] 1-800-659-3381 Substance Abuse/Addiction Treatment  ° °[place] Behavioral Health Resources °Organization         Address  Phone  Notes  °[facility]  (888) 581-9988   °Julie Brannon, PhD [address] [place], [place]   (336) 349-5553 or (336) 951-0000   °[facility]   [address] °[place], [place] (336) 349-4454   °[facility] [address], [place], [place] (336) 342-8316 Insurance/Medicaid/sponsorship through [facility]  °[facility] [address]                                    [place], [place] (336) 342-8316 Therapy/tele-psych/case  °[facility] [address]  ° [place], [place] (336) 349-2233    °Dr. Arfeen  (336) 349-4544   °[facility] [place]  [employer] [place] [facility] 1) [address], [place] °2) [address], [place] °3)  [address], [place] (336) 349-3220 °(336) 342-7768 ° °(336) 342-8140   °[place] [facility] (336) 342-1394 or (336) 342-3537 (After Hours)    ° ° ° °

## 2015-08-07 NOTE — ED Provider Notes (Signed)
CSN: [id number]     Arrival date & time 08/07/15  1144 History   First MD Initiated Contact with Patient 08/07/15 1154     Chief Complaint  Patient presents with  . Chest Pain     (Consider location/radiation/quality/duration/timing/severity/associated sxs/prior Treatment) HPI   John Harrington is a 49 y.o. male with PMH significant for HTN and legal blindness who presents with 2 week history of gradual onset, constant, worsening, non-radiating, 2/10, left sided chest pain that is worse with movement and bending over and without relieving factors.  No exertional component.  No fevers, N/V, cough, SOB, leg swelling, dizziness, lightheadedness, syncope, diaphoresis, abdominal pain, or urinary symptoms.  He reports he has not taken any of his home medications for >6 months.  He does not have a PCP.  Nothing like this has happened before.  He is a Location managermachine operator and notes that his chest pain is worse at work when he is constantly moving his arm.  [facility] and they gave him 325 ASA and 2 NTG.  He states his pain is unchanged by these interventions.  No hx of DVT/PE.  No recent trauma or surgery.   Past Medical History  Diagnosis Date  . Hypertension   . Legally blind    No past surgical history on file. No family history on file. Social History  Substance Use Topics  . Smoking status: Current Every Day Smoker -- 1.00 packs/day    Types: Cigarettes  . Smokeless tobacco: Not on file  . Alcohol Use: Yes    Review of Systems All other systems negative unless otherwise stated in HPI    Allergies  Review of patient's allergies indicates no known allergies.  Home Medications   Prior to Admission medications   Medication Sig Start Date End Date Taking? Authorizing Provider  azelastine (OPTIVAR) 0.05 % ophthalmic solution Place 1 drop into both eyes 2 (two) times daily. Patient not taking: Reported on 08/07/2015 01/10/13   Sharin GraveAdlih Moreno-Coll, MD  erythromycin  ophthalmic ointment Place a 1/2 inch ribbon of ointment into the lower conjunctiva bilateral tid for 7 days. Patient not taking: Reported on 08/07/2015 01/10/13   Adlih Moreno-Coll, MD  fexofenadine (ALLEGRA) 180 MG tablet Take 1 tablet (180 mg total) by mouth daily as needed. Patient not taking: Reported on 08/07/2015 01/10/13   Adlih Moreno-Coll, MD  fluticasone (FLONASE) 50 MCG/ACT nasal spray Place 2 sprays into the nose daily. Patient not taking: Reported on 08/07/2015 01/10/13   Adlih Moreno-Coll, MD  guaiFENesin-codeine (ROBITUSSIN AC) 100-10 MG/5ML syrup Take 5 mLs by mouth 3 (three) times daily as needed for cough. Patient not taking: Reported on 08/07/2015 01/10/13   Christin FudgeAdlih Moreno-Coll, MD  ibuprofen (ADVIL,MOTRIN) 800 MG tablet Take 1 tablet (800 mg total) by mouth 3 (three) times daily. 08/07/15   Cheri FowlerKayla Johnny Latu, PA-C  OVER THE COUNTER MEDICATION Patient has been off medicine for 6 months, ran out    Historical Provider, MD  triamterene-hydrochlorothiazide (MAXZIDE-25) 37.5-25 MG per tablet Take 1 tablet by mouth daily. Patient not taking: Reported on 08/07/2015 01/10/13   Adlih Moreno-Coll, MD   BP 139/85 mmHg  Pulse 62  Temp(Src) 98.5 F (36.9 C) (Oral)  Resp 18  SpO2 98% Physical Exam  Constitutional: He is oriented to person, place, and time. He appears well-developed and well-nourished.  HENT:  Head: Normocephalic and atraumatic.  Mouth/Throat: Oropharynx is clear and moist.  Eyes: Conjunctivae are normal. Pupils are equal, round, and reactive to  light.  Neck: Normal range of motion. Neck supple.  Cardiovascular: Normal rate, regular rhythm and normal heart sounds.   No murmur heard. Pulses:      Radial pulses are 2+ on the right side, and 2+ on the left side.  Pulmonary/Chest: Effort normal and breath sounds normal. No accessory muscle usage or stridor. No respiratory distress. He has no wheezes. He has no rhonchi. He has no rales.  Abdominal: Soft. Bowel sounds are normal. He  exhibits no distension. There is no tenderness. There is no rebound and no guarding.  Musculoskeletal: Normal range of motion.  Lymphadenopathy:    He has no cervical adenopathy.  Neurological: He is alert and oriented to person, place, and time.  Speech clear without dysarthria.  Skin: Skin is warm and dry.  Psychiatric: He has a normal mood and affect. His behavior is normal.    ED Course  Procedures (including critical care time) Labs Review Labs Reviewed  CBC - Abnormal; Notable for the following:    WBC 12.7 (*)    All other components within normal limits  BASIC METABOLIC PANEL - Abnormal; Notable for the following:    Glucose, Bld 126 (*)    All other components within normal limits  I-STAT TROPOININ, ED    Imaging Review Dg Chest 2 View  08/07/2015  CLINICAL DATA:  Chest pain ongoing for 2 weeks.  Hypertension EXAM: CHEST  2 VIEW COMPARISON:  04/24/2007 FINDINGS: Normal heart size and mediastinal contours. No acute infiltrate or edema. No effusion or pneumothorax. No acute osseous findings. IMPRESSION: Negative chest. Electronically Signed   By: Marnee Spring M.D.   On: 08/07/2015 13:14   I have personally reviewed and evaluated these images and lab results as part of my medical decision-making.   EKG Interpretation   Date/Time:  Thursday August 07 2015 11:57:30 EST Ventricular Rate:  60 PR Interval:  150 QRS Duration: 102 QT Interval:  416 QTC Calculation: 416 R Axis:   46 Text Interpretation:  Sinus rhythm ST elev, probable normal early repol  pattern No old tracing to compare Confirmed by GOLDSTON  MD, SCOTT ([id number])  on 08/07/2015 1:19:41 PM      MDM   Final diagnoses:  Atypical chest pain    Patient presents with 2 week history of CP.  No fever.  No exertional component.  VSS, NAD.  On exam, heart RRR, lungs CTAB, abdomen soft and benign.  HEART score 3.  Will obtain EKG, CXR, and labs.  Concern for cardiac etiology vs musculoskeletal vs PE vs  pericarditis vs PNA.  PERC negative, doubt PE.  Troponin 0.00.  CBC with WBC 12.7.  BMP unremarkable.  CXR negative.  EKG shows NSR, no acute changes.  Doubt cardiac etiology.  Suspect musculoskeletal.  Will d/c home with motrin. Evaluation does not show pathology requring ongoing emergent intervention or admission. Pt is hemodynamically stable and mentating appropriately. Discussed findings/results and plan with patient/guardian, who agrees with plan. All questions answered. Return precautions discussed and outpatient follow up given.  Case has been discussed with and seen by Dr. Criss Alvine who agrees with the above plan for discharge.     Cheri Fowler, PA-C 08/07/15 1507  Pricilla Loveless, MD 08/09/15 [phone]

## 2016-04-05 ENCOUNTER — Encounter (HOSPITAL_COMMUNITY): Payer: Self-pay | Admitting: Emergency Medicine

## 2016-04-05 ENCOUNTER — Ambulatory Visit (HOSPITAL_COMMUNITY)
Admission: EM | Admit: 2016-04-05 | Discharge: 2016-04-05 | Disposition: A | Discharge status: Home / Self Care | Payer: Medicare Other | Attending: Family Medicine | Admitting: Family Medicine

## 2016-04-05 DIAGNOSIS — B356 Tinea cruris: Secondary | ICD-10-CM | POA: Diagnosis not present

## 2016-04-05 MED ORDER — HYDROCORTISONE 2.5 % EX CREA: TOPICAL_CREAM | Freq: Two times a day (BID) | CUTANEOUS | 0 refills | Status: AC

## 2016-04-05 MED ORDER — CICLOPIROX OLAMINE 0.77 % EX CREA: TOPICAL_CREAM | Freq: Two times a day (BID) | CUTANEOUS | 0 refills | Status: AC

## 2016-04-05 NOTE — ED Provider Notes (Signed)
[facility]    CSN: [id number] Arrival date & time: 04/05/16  1010  First Provider Contact:  First MD Initiated Contact with Patient 04/05/16 1131    History   Chief Complaint Chief Complaint  Patient presents with  . Rash    HPI John Harrington is a 50 y.o. male.   HPI He reports 1 month of constant itchy red rash in the groin R > L that has not spread, but continues to be bothersome. He has tried OTC benadryl cream and calamine lotion without improvement. He denies other areas of rash, specifically of the hands, axillae, feet. No history of psoriasis. Lives alone and has no known affected contacts.   Past Medical History:  Diagnosis Date  . Hypertension   . Legally blind     Patient Active Problem List   Diagnosis Date Noted  . Essential hypertension, benign 08/07/2015  . Diabetes mellitus without complication (HCC) 08/07/2015  . Tobacco use disorder 08/07/2015    History reviewed. No pertinent surgical history.   Home Medications    Prior to Admission medications   Medication Sig Start Date End Date Taking? Authorizing Provider  METFORMIN HCL PO Take by mouth.   Yes Historical Provider, MD  OVER THE COUNTER MEDICATION Patient took blood pressure medicine this morning.  Patient cannot state the name of medicine, but says it begins with an "O"   Yes Historical Provider, MD  azelastine (OPTIVAR) 0.05 % ophthalmic solution Place 1 drop into both eyes 2 (two) times daily. Patient not taking: Reported on 08/07/2015 01/10/13   Adlih Moreno-Coll, MD  ciclopirox (LOPROX) 0.77 % cream Apply topically 2 (two) times daily. 04/05/16   Tyrone Nine, MD  erythromycin ophthalmic ointment Place a 1/2 inch ribbon of ointment into the lower conjunctiva bilateral tid for 7 days. Patient not taking: Reported on 08/07/2015 01/10/13   Adlih Moreno-Coll, MD  fexofenadine (ALLEGRA) 180 MG tablet Take 1 tablet (180 mg total) by mouth daily as needed. Patient not taking: Reported on  08/07/2015 01/10/13   Adlih Moreno-Coll, MD  fluticasone (FLONASE) 50 MCG/ACT nasal spray Place 2 sprays into the nose daily. Patient not taking: Reported on 08/07/2015 01/10/13   Adlih Moreno-Coll, MD  guaiFENesin-codeine (ROBITUSSIN AC) 100-10 MG/5ML syrup Take 5 mLs by mouth 3 (three) times daily as needed for cough. Patient not taking: Reported on 08/07/2015 01/10/13   Adlih Moreno-Coll, MD  hydrocortisone 2.5 % cream Apply topically 2 (two) times daily. 04/05/16   Tyrone Nine, MD  ibuprofen (ADVIL,MOTRIN) 800 MG tablet Take 1 tablet (800 mg total) by mouth 3 (three) times daily. 08/07/15   Cheri Fowler, PA-C  OVER THE COUNTER MEDICATION Patient has been off medicine for 6 months, ran out    Historical Provider, MD  triamterene-hydrochlorothiazide (MAXZIDE-25) 37.5-25 MG per tablet Take 1 tablet by mouth daily. Patient not taking: Reported on 08/07/2015 01/10/13   Sharin Grave, MD   Family History No family history on file.  Social History Social History  Substance Use Topics  . Smoking status: Current Every Day Smoker    Packs/day: 1.00    Types: Cigarettes  . Smokeless tobacco: Never Used  . Alcohol use Yes     Allergies   Review of patient's allergies indicates no known allergies.   Review of Systems Review of Systems No fevers.  Physical Exam Triage Vital Signs ED Triage Vitals [04/05/16 1107]  Enc Vitals Group     BP 152/98     Pulse Rate 75  Resp      Temp 98.2 F (36.8 C)     Temp Source Oral     SpO2 96 %     Weight      Height      Head Circumference      Peak Flow      Pain Score      Pain Loc      Pain Edu?      Excl. in GC?    No data found.   Updated Vital Signs BP 152/98 (BP Location: Right Arm)   Pulse 75   Temp 98.2 F (36.8 C) (Oral)   SpO2 96%   Physical Exam  Constitutional: He is oriented to person, place, and time. He appears well-developed and well-nourished. No distress.  Neck: Normal range of motion. Neck supple.    Cardiovascular: Normal heart sounds.   Pulmonary/Chest: Effort normal and breath sounds normal.  Abdominal: Soft. Bowel sounds are normal. He exhibits no distension. There is no tenderness.  Genitourinary: Penis normal.  Neurological: He is alert and oriented to person, place, and time.  Skin:  Pruritic erythematous papular eruption with excoriation on right proximal medial thigh extending to scrotum with occasional scale and left inguinal crease with well-defined hyperpigmentation. No other lesions on arms, hands, axillae, trunk, legs or feet.     UC Treatments / Results  Labs (all labs ordered are listed, but only abnormal results are displayed) Labs Reviewed - No data to display  EKG  EKG Interpretation None      Radiology No results found.  Procedures Procedures (including critical care time)  Medications Ordered in UC Medications - No data to display   Initial Impression / Assessment and Plan / UC Course  I have reviewed the triage vital signs and the nursing notes.  Pertinent labs & imaging results that were available during my care of the patient were reviewed by me and considered in my medical decision making (see chart for details).  Final Clinical Impressions(s) / UC Diagnoses   Final diagnoses:  Tinea cruris   50 y.o. male with symptoms consistent with tinea cruris x 1 month without evidence of superimposed bacterial infection. DDx includes inverse psoriasis (not a typical rash morphology) and erythrasma. Will treat with topical antifungal and topical steroid to alleviate itching, as it's possible scratching has worsened initial rash. Patient advised to return for care if rash is not improved, at which time wood's lamp and KOH prep may be considered.   Elevated BP noted, not severe range, asymptomatic. F/u with PCP  New Prescriptions New Prescriptions   CICLOPIROX (LOPROX) 0.77 % CREAM    Apply topically 2 (two) times daily.   HYDROCORTISONE 2.5 % CREAM     Apply topically 2 (two) times daily.     Tyrone Nineyan B Amaliya Whitelaw, MD 04/05/16 [phone]

## 2016-04-05 NOTE — ED Triage Notes (Signed)
Patient reports a rash in right groin area for a month.  Patient has used otc remedies.

## 2017-03-01 IMAGING — DX DG CHEST 2V
2 series · 2 of 2 positions shown · non-contrast
Comparison: 04/24/2007

CLINICAL DATA: Chest pain ongoing for 2 weeks.  Hypertension

EXAM:
CHEST  2 VIEW

[w chest pa]
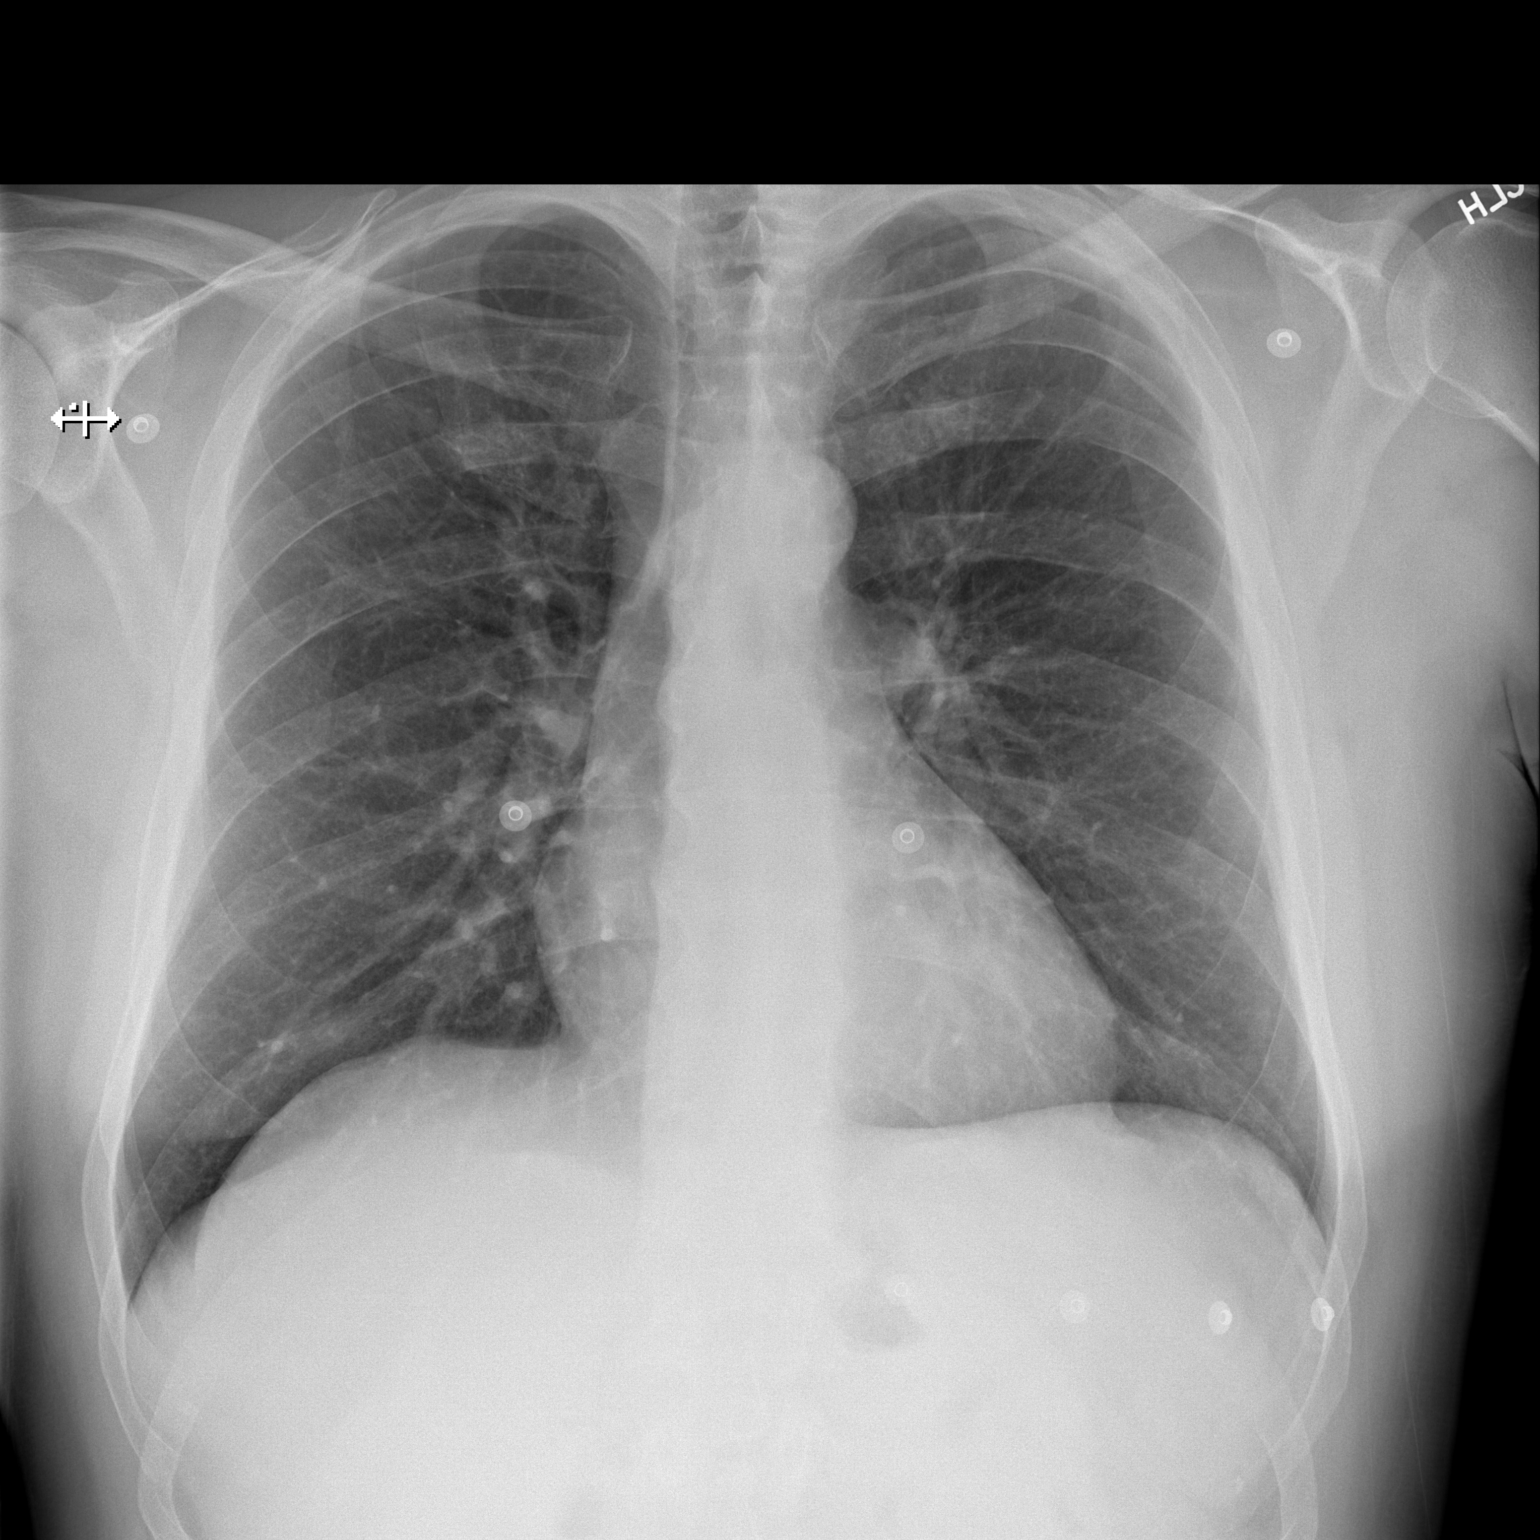

[w chest lat]
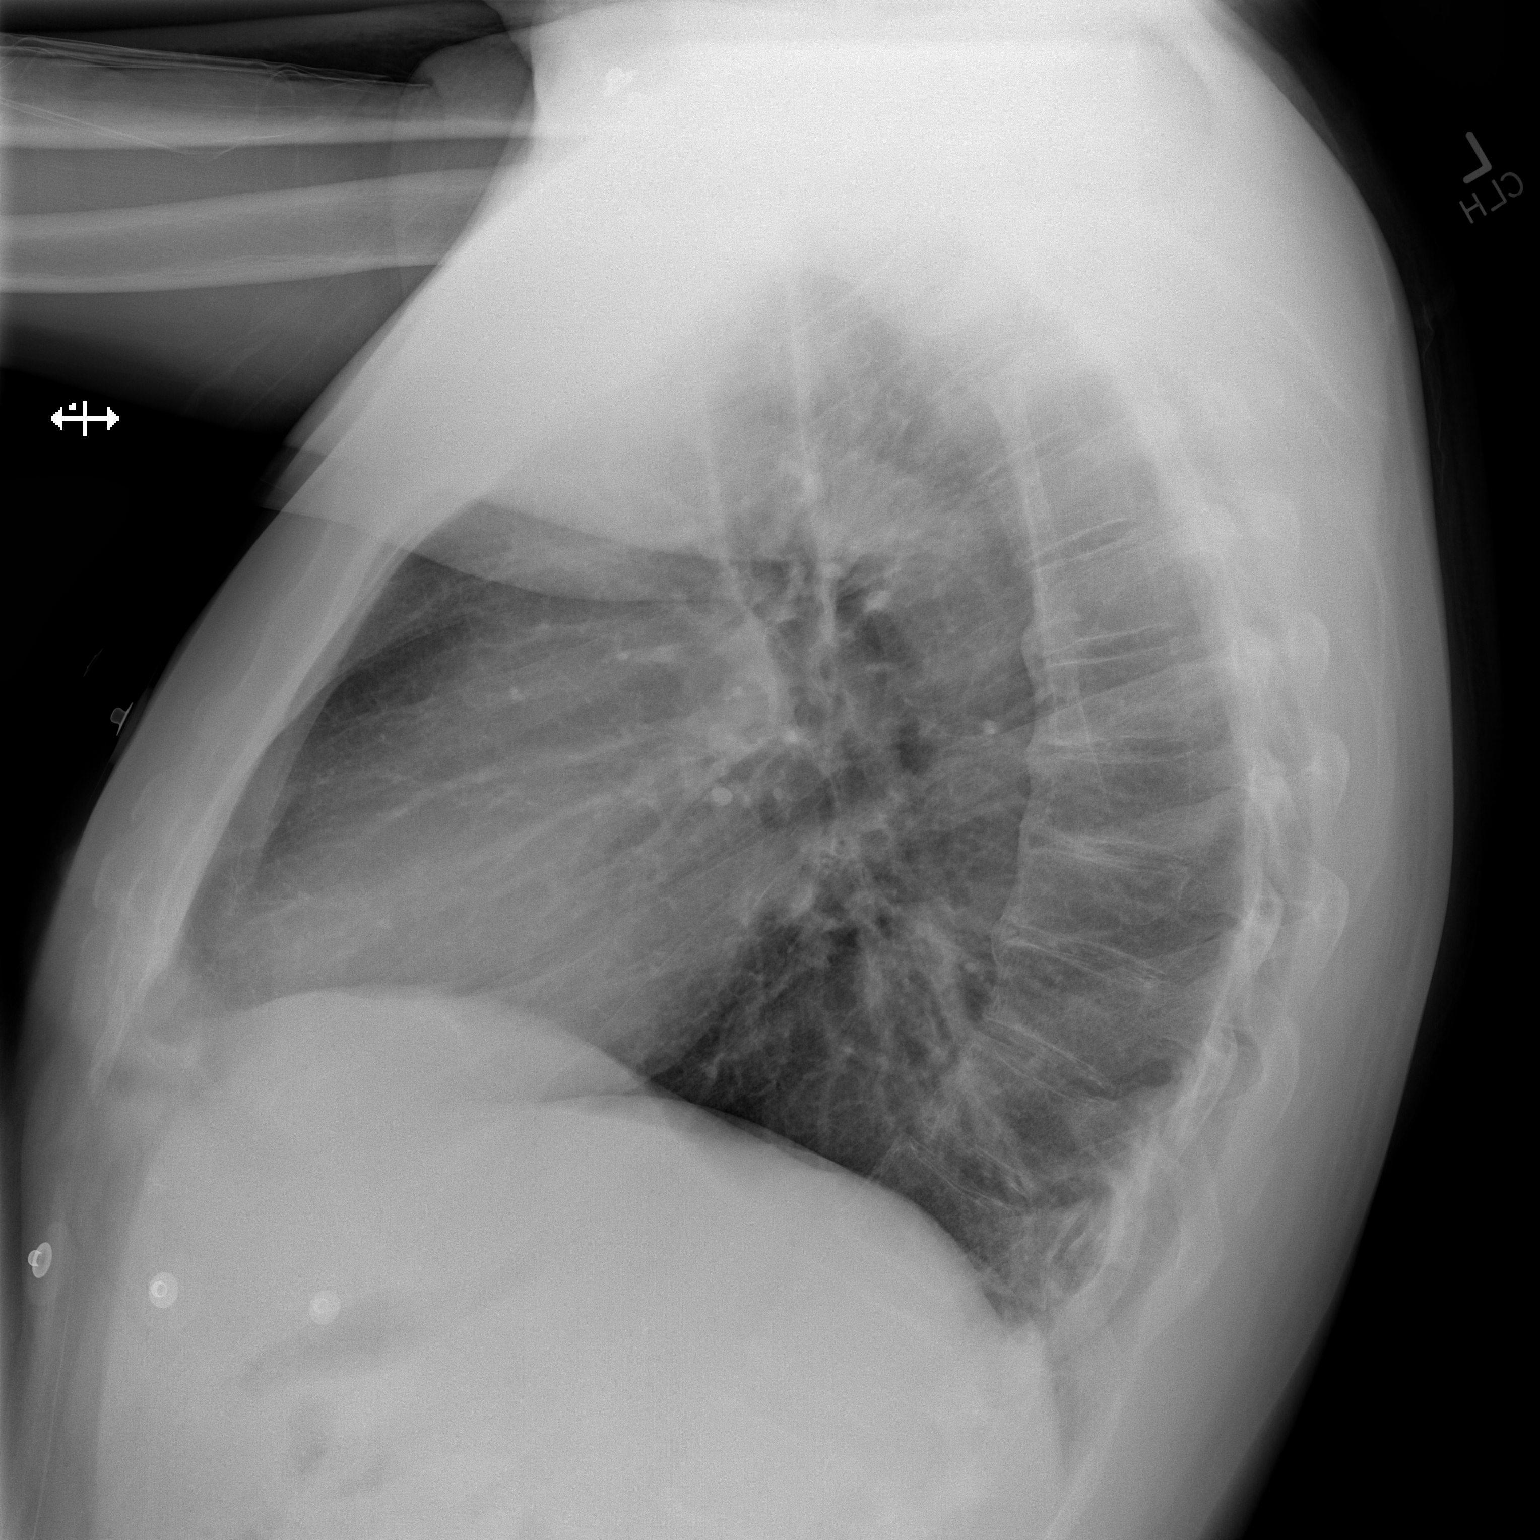

[2 of 2 positions shown; findings below may reference images not displayed]

FINDINGS: Normal heart size and mediastinal contours. No acute infiltrate or
edema. No effusion or pneumothorax. No acute osseous findings.
IMPRESSION: Negative chest.

## 2021-08-25 ENCOUNTER — Other Ambulatory Visit: Payer: Self-pay

## 2021-08-25 ENCOUNTER — Ambulatory Visit (INDEPENDENT_AMBULATORY_CARE_PROVIDER_SITE_OTHER): Payer: Medicare Other

## 2021-08-25 ENCOUNTER — Ambulatory Visit (HOSPITAL_COMMUNITY): Payer: Medicare Other

## 2021-08-25 ENCOUNTER — Ambulatory Visit (HOSPITAL_COMMUNITY)
Admission: EM | Admit: 2021-08-25 | Discharge: 2021-08-25 | Disposition: A | Discharge status: Home / Self Care | Payer: Medicare Other | Attending: Emergency Medicine | Admitting: Emergency Medicine

## 2021-08-25 ENCOUNTER — Encounter (HOSPITAL_COMMUNITY): Payer: Self-pay | Admitting: Emergency Medicine

## 2021-08-25 DIAGNOSIS — R051 Acute cough: Secondary | ICD-10-CM | POA: Diagnosis not present

## 2021-08-25 DIAGNOSIS — R0602 Shortness of breath: Secondary | ICD-10-CM

## 2021-08-25 DIAGNOSIS — I10 Essential (primary) hypertension: Secondary | ICD-10-CM

## 2021-08-25 DIAGNOSIS — R059 Cough, unspecified: Secondary | ICD-10-CM

## 2021-08-25 MED ORDER — ALBUTEROL SULFATE HFA 108 (90 BASE) MCG/ACT IN AERS: 2.0000 | INHALATION_SPRAY | RESPIRATORY_TRACT | 0 refills | Status: AC | PRN

## 2021-08-25 MED ORDER — LISINOPRIL 5 MG PO TABS: 5.0000 mg | ORAL_TABLET | Freq: Every day | ORAL | 2 refills | Status: AC

## 2021-08-25 MED ORDER — PREDNISONE 10 MG (21) PO TBPK: ORAL_TABLET | Freq: Every day | ORAL | 0 refills | Status: AC

## 2021-08-25 NOTE — ED Triage Notes (Signed)
Pt reports has cough for 6 months. Been given steroid packs for it.

## 2021-08-25 NOTE — ED Provider Notes (Signed)
[facility]    CSN: [id number] Arrival date & time: 08/25/21  0803      History   Chief Complaint Chief Complaint  Patient presents with   Cough    HPI John Harrington is a 55 y.o. male.   Presents with not productive cough, nasal congestion, rhinorrhea, intermittent shortness of breath primarily with exertion and chest congestion occurring intermittently for 6 months.  Endorses that symptoms temporarily get better and then returned.  Endorses that he was told that he has bronchitis but denies COPD or asthma.  Daily tobacco smoker.  Has attempted use of Mucinex, somewhat helpful. History of DM, Hypertension.  Denies fever, chills, body aches, chest tightness, wheezing.    Past Medical History:  Diagnosis Date   Hypertension    Legally blind     Patient Active Problem List   Diagnosis Date Noted   Essential hypertension, benign 08/07/2015   Diabetes mellitus without complication (HCC) 08/07/2015   Tobacco use disorder 08/07/2015    History reviewed. No pertinent surgical history.     Home Medications    Prior to Admission medications   Medication Sig Start Date End Date Taking? Authorizing Provider  azelastine (OPTIVAR) 0.05 % ophthalmic solution Place 1 drop into both eyes 2 (two) times daily. Patient not taking: Reported on 08/07/2015 01/10/13   Moreno-Coll, Adlih, MD  ciclopirox (LOPROX) 0.77 % cream Apply topically 2 (two) times daily. 04/05/16   Tyrone Nine, MD  erythromycin ophthalmic ointment Place a 1/2 inch ribbon of ointment into the lower conjunctiva bilateral tid for 7 days. Patient not taking: Reported on 08/07/2015 01/10/13   Moreno-Coll, Adlih, MD  fexofenadine (ALLEGRA) 180 MG tablet Take 1 tablet (180 mg total) by mouth daily as needed. Patient not taking: Reported on 08/07/2015 01/10/13   Moreno-Coll, Adlih, MD  fluticasone (FLONASE) 50 MCG/ACT nasal spray Place 2 sprays into the nose daily. Patient not taking: Reported on 08/07/2015 01/10/13    Moreno-Coll, Adlih, MD  guaiFENesin-codeine (ROBITUSSIN AC) 100-10 MG/5ML syrup Take 5 mLs by mouth 3 (three) times daily as needed for cough. Patient not taking: Reported on 08/07/2015 01/10/13   Moreno-Coll, Adlih, MD  hydrocortisone 2.5 % cream Apply topically 2 (two) times daily. 04/05/16   Tyrone Nine, MD  ibuprofen (ADVIL,MOTRIN) 800 MG tablet Take 1 tablet (800 mg total) by mouth 3 (three) times daily. 08/07/15   Cheri Fowler, PA-C  METFORMIN HCL PO Take by mouth.    [provider]  OVER THE COUNTER MEDICATION Patient has been off medicine for 6 months, ran out    [provider]  OVER THE COUNTER MEDICATION Patient took blood pressure medicine this morning.  Patient cannot state the name of medicine, but says it begins with an "O"    [provider]  triamterene-hydrochlorothiazide (MAXZIDE-25) 37.5-25 MG per tablet Take 1 tablet by mouth daily. Patient not taking: Reported on 08/07/2015 01/10/13   Moreno-Coll, Christin Fudge, MD    Family History No family history on file.  Social History Social History   Tobacco Use   Smoking status: Every Day    Packs/day: 1.00    Types: Cigarettes   Smokeless tobacco: Never  Substance Use Topics   Alcohol use: Yes     Allergies   Patient has no known allergies.   Review of Systems Review of Systems  Constitutional: Negative.   HENT:  Positive for congestion and rhinorrhea. Negative for dental problem, drooling, ear discharge, ear pain, facial swelling, hearing loss, mouth  sores, nosebleeds, postnasal drip, sinus pressure, sinus pain, sneezing, sore throat, tinnitus, trouble swallowing and voice change.   Respiratory:  Positive for cough and shortness of breath. Negative for apnea, choking, chest tightness, wheezing and stridor.   Cardiovascular: Negative.   Gastrointestinal: Negative.   Skin: Negative.   Neurological: Negative.     Physical Exam Triage Vital Signs ED Triage Vitals  Enc Vitals Group     BP  08/25/21 0819 (!) 187/104     Pulse Rate 08/25/21 0819 86     Resp 08/25/21 0819 19     Temp 08/25/21 0819 98.2 F (36.8 C)     Temp Source 08/25/21 0819 Oral     SpO2 08/25/21 0819 99 %     Weight --      Height --      Head Circumference --      Peak Flow --      Pain Score 08/25/21 0818 0     Pain Loc --      Pain Edu? --      Excl. in GC? --    No data found.  Updated Vital Signs BP (!) 187/104 (BP Location: Left Arm) Comment: no PCP to prescribed HTN meds   Pulse 86    Temp 98.2 F (36.8 C) (Oral)    Resp 19    SpO2 99%   Visual Acuity Right Eye Distance:   Left Eye Distance:   Bilateral Distance:    Right Eye Near:   Left Eye Near:    Bilateral Near:     Physical Exam Constitutional:      Appearance: Normal appearance.  HENT:     Head: Normocephalic.  Eyes:     Extraocular Movements: Extraocular movements intact.  Cardiovascular:     Rate and Rhythm: Normal rate and regular rhythm.     Pulses: Normal pulses.     Heart sounds: Normal heart sounds.  Pulmonary:     Effort: Pulmonary effort is normal.     Breath sounds: Wheezing present.  Skin:    General: Skin is warm and dry.  Neurological:     Mental Status: He is alert and oriented to person, place, and time. Mental status is at baseline.  Psychiatric:        Mood and Affect: Mood normal.        Behavior: Behavior normal.     UC Treatments / Results  Labs (all labs ordered are listed, but only abnormal results are displayed) Labs Reviewed - No data to display  EKG   Radiology No results found.  Procedures Procedures (including critical care time)  Medications Ordered in UC Medications - No data to display  Initial Impression / Assessment and Plan / UC Course  I have reviewed the triage vital signs and the nursing notes.  Pertinent labs & imaging results that were available during my care of the patient were reviewed by me and considered in my medical decision making (see chart for  details).  Acute cough Shortness of breath Elevated blood pressure reading with diagnosis of hypertension  Vital signs are stable, patient no signs of distress, stable for outpatient treatment, lungs wheezing on exam on exam, O2 saturation 99% on room air, chest x-ray negative, prednisone 60 mg taper prescribed as well as albuterol inhaler, pulmonology walking referral given  Blood pressure in triage 187/104, patient endorses that he has been told he has hypertension with not been on medication for years, does not currently have PCP,  internal referral replaced, no signs of hypertensive urgency, given strict return precautions that if signs occur to go to nearest emergency department for evaluation, prescribed lisinopril 5 mg daily, advised patient to measure and record blood pressure daily and to decrease salt intake, urgent care follow-up as needed Final Clinical Impressions(s) / UC Diagnoses   Final diagnoses:  None   Discharge Instructions   None    ED Prescriptions   None    PDMP not reviewed this encounter.   Valinda Hoar, [place] 08/25/21 [phone]

## 2021-08-25 NOTE — Discharge Instructions (Signed)
For your cough  Your chest x-ray today was negative for infection or structural changes Take prednisone every morning with food as directed on the package You may take 2 puffs of the albuterol inhaler every 4 hours as needed for wheezing or shortness of breath You have been given information for a lung specialist you may follow-up as needed for further evaluation of your lungs and management of your bronchitis   For your blood pressure  Take lisinopril every morning around the same time Measure your blood pressure daily and record around the same time A primary care referral has been placed for you to establish care for management of your blood pressure Please try to maintain a healthy diet, increase your water intake and and decrease salt intake Please follow-up with the nearest emergency department if your blood pressure is elevated and you have shortness of breath, headaches, dizziness, lightheadedness, chest pain or tightness

## 2023-03-20 IMAGING — DX DG CHEST 2V
3 series · 3 of 3 positions shown · non-contrast
Comparison: Chest radiograph 08/07/2015

CLINICAL DATA: Cough for 6 months

EXAM:
CHEST - 2 VIEW

[chest lat (1 of 2)]
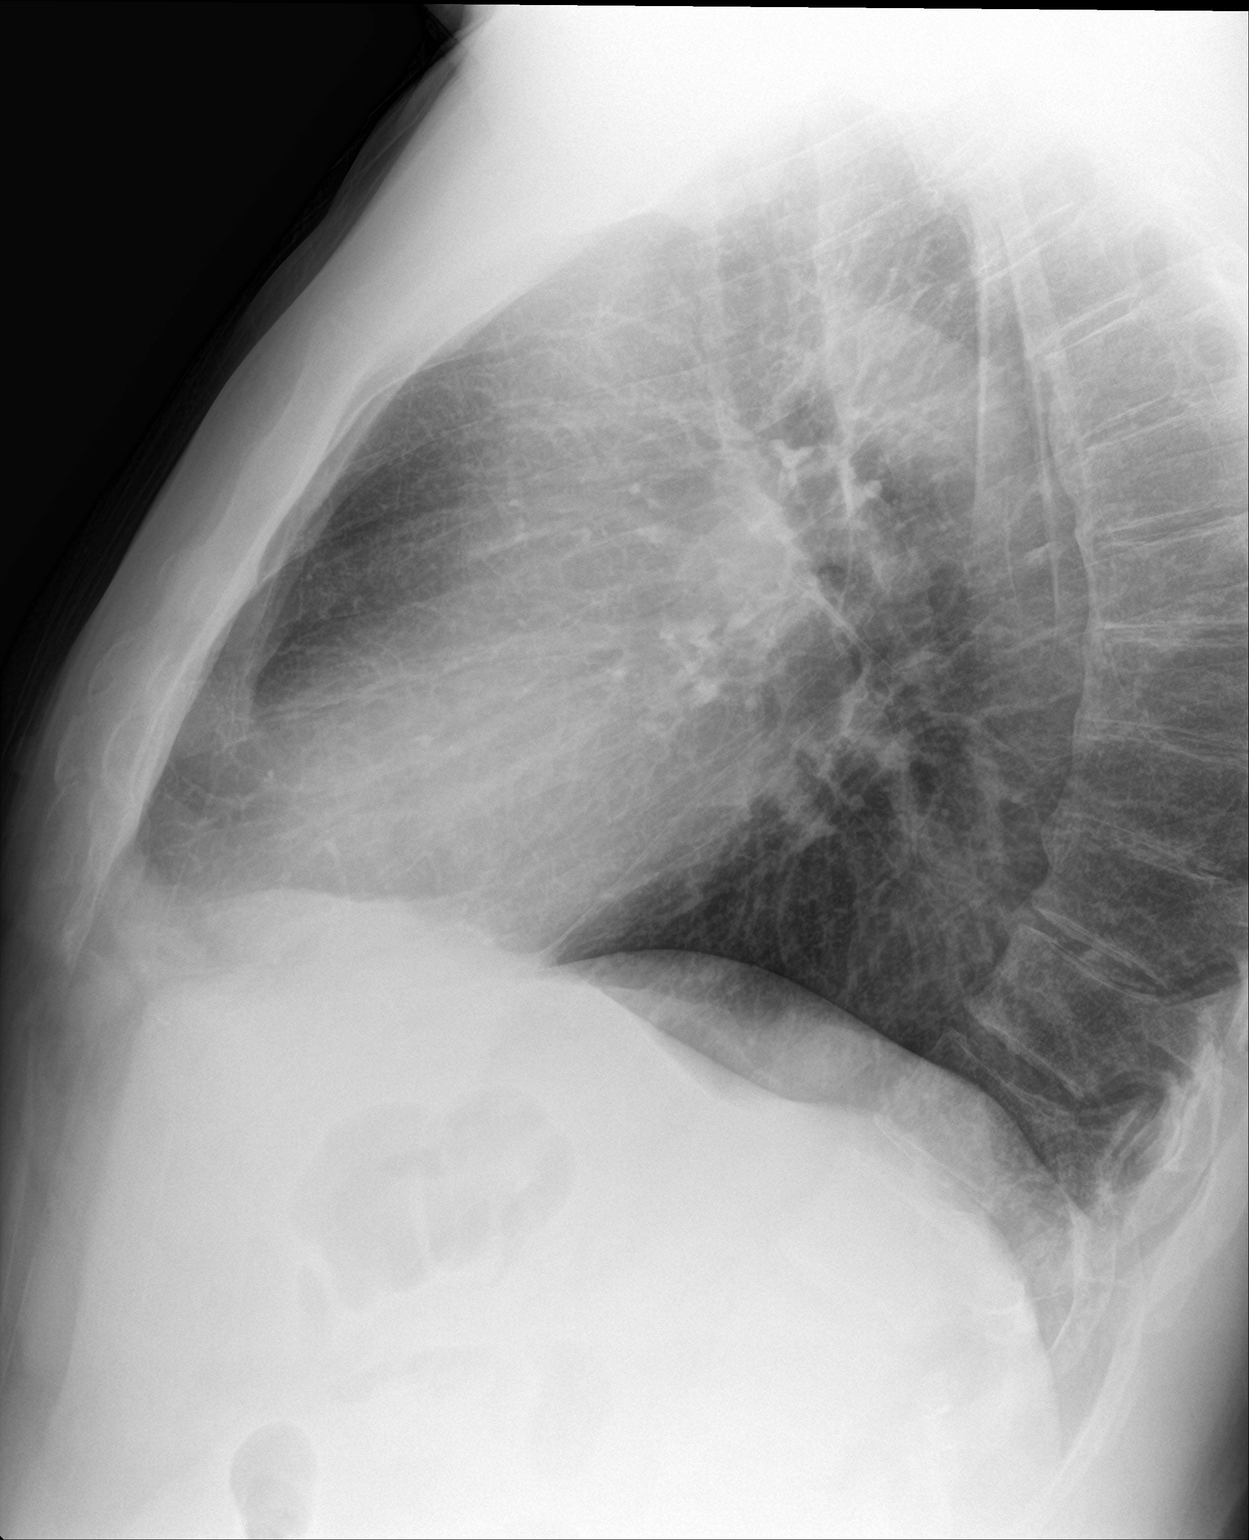

[chest pa]
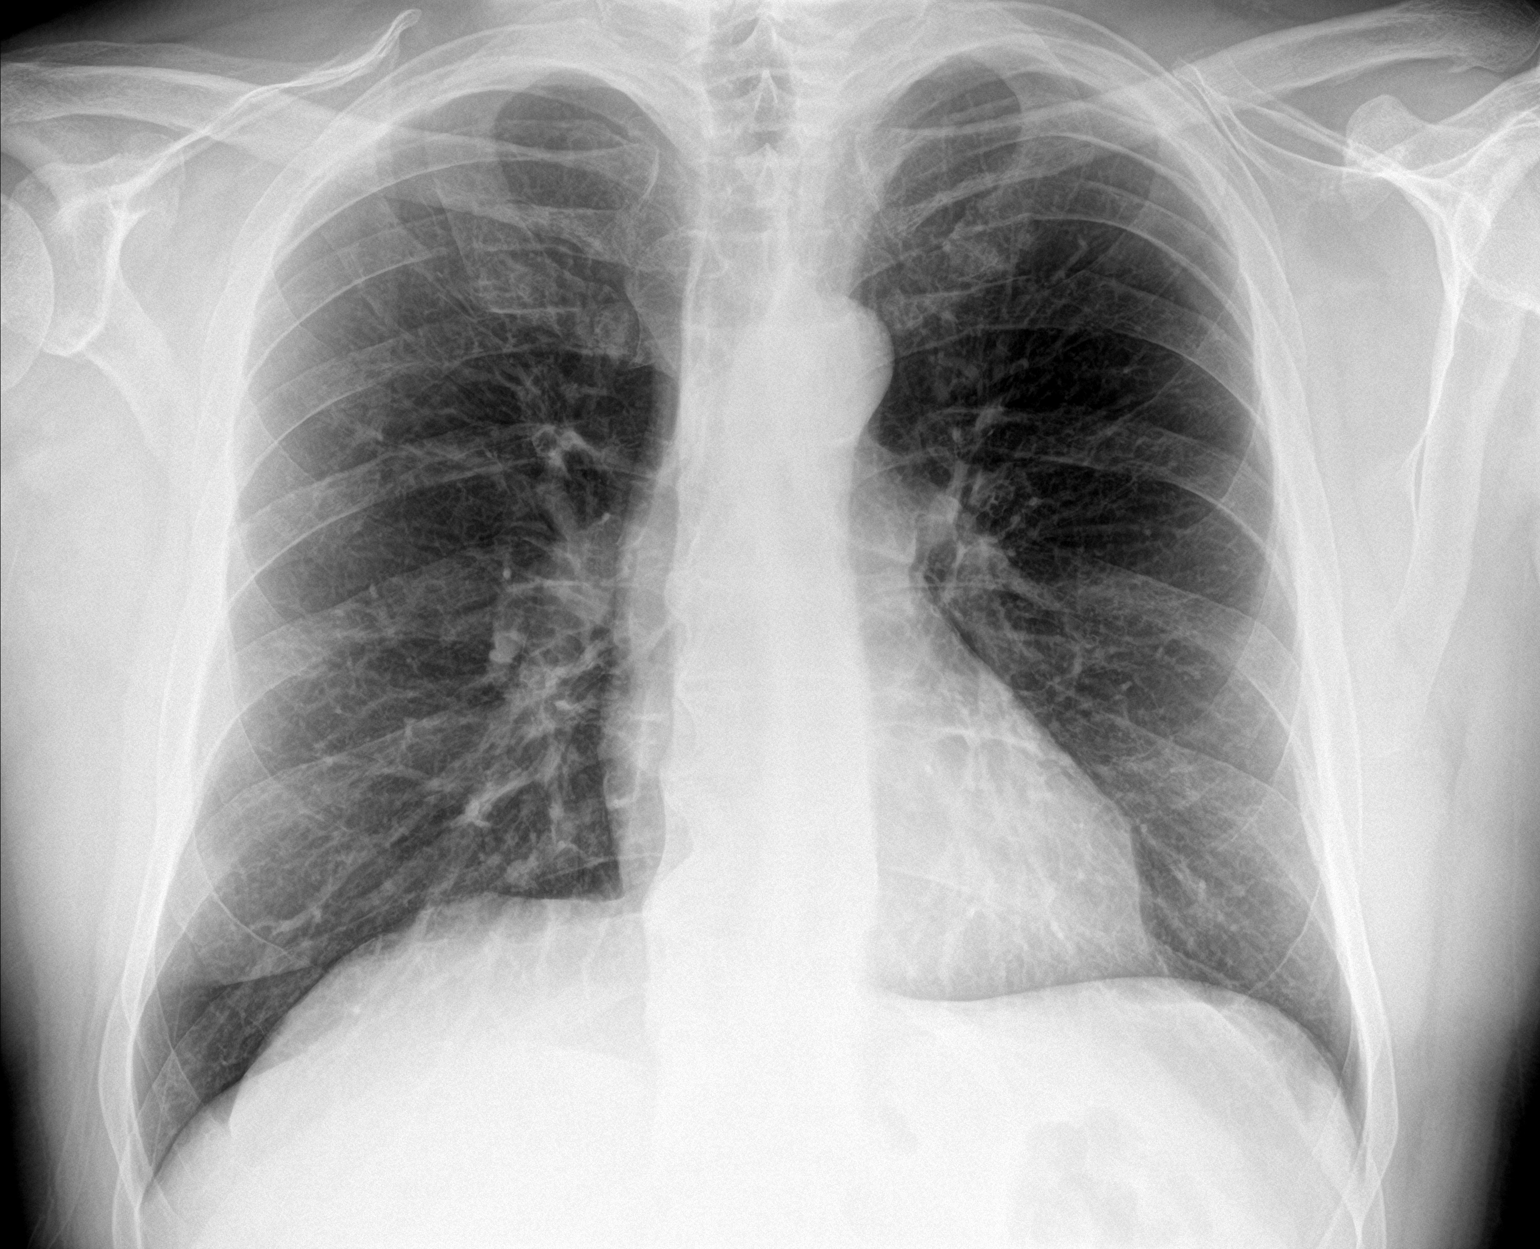

[chest lat (2 of 2)]
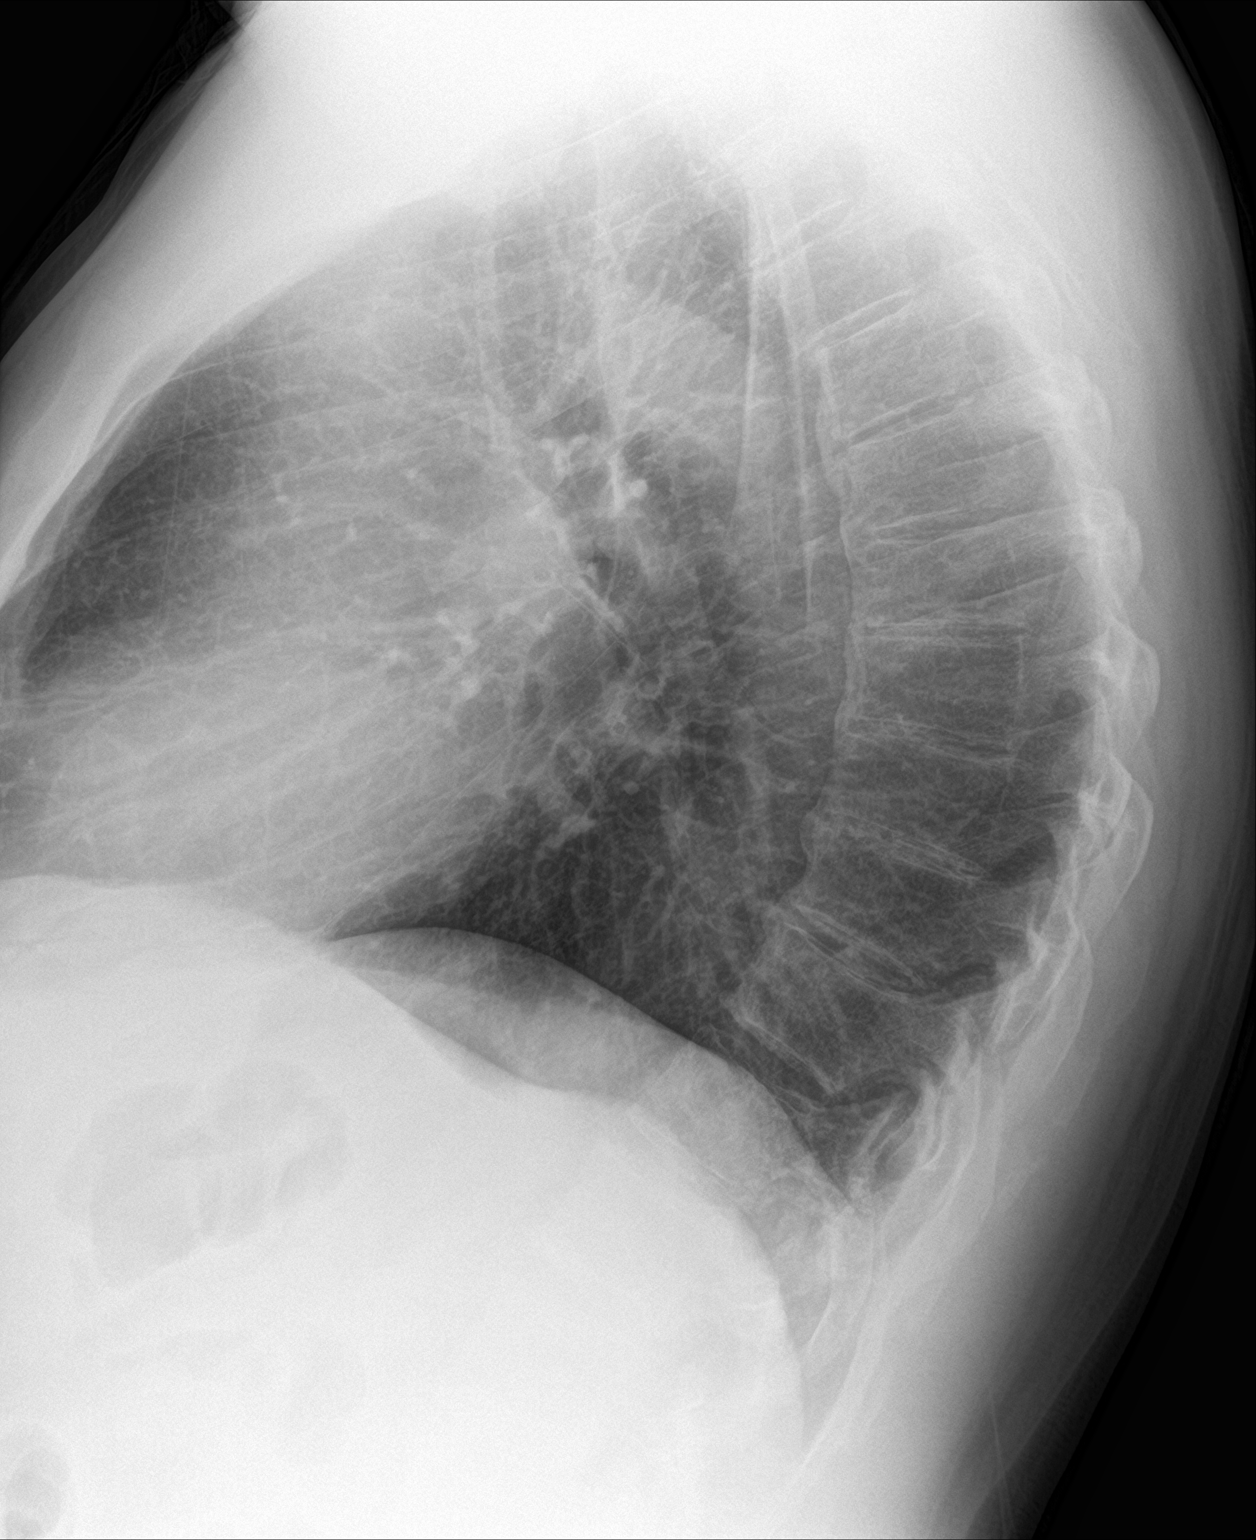

[3 of 3 positions shown; findings below may reference images not displayed]

FINDINGS: The cardiomediastinal silhouette is normal.

There is no focal consolidation or pulmonary edema. There is no
pleural effusion or pneumothorax

There is multilevel degenerative change of the thoracic spine. There
is no acute osseous abnormality.
IMPRESSION: No radiographic evidence of acute cardiopulmonary process.

## 2024-08-28 ENCOUNTER — Other Ambulatory Visit: Payer: Self-pay

## 2024-08-28 ENCOUNTER — Ambulatory Visit (HOSPITAL_COMMUNITY)
Admission: EM | Admit: 2024-08-28 | Discharge: 2024-08-28 | Disposition: A | Discharge status: Home / Self Care | Source: Home / Self Care

## 2024-08-28 ENCOUNTER — Encounter (HOSPITAL_COMMUNITY): Payer: Self-pay

## 2024-08-28 ENCOUNTER — Ambulatory Visit (HOSPITAL_COMMUNITY): Payer: Self-pay | Admitting: Emergency Medicine

## 2024-08-28 ENCOUNTER — Emergency Department (HOSPITAL_COMMUNITY)
Admission: EM | Admit: 2024-08-28 | Discharge: 2024-08-28 | Disposition: A | Discharge status: Home / Self Care | Source: Ambulatory Visit | Attending: Emergency Medicine | Admitting: Emergency Medicine

## 2024-08-28 ENCOUNTER — Emergency Department (HOSPITAL_COMMUNITY)

## 2024-08-28 DIAGNOSIS — R195 Other fecal abnormalities: Secondary | ICD-10-CM | POA: Diagnosis not present

## 2024-08-28 DIAGNOSIS — R1030 Lower abdominal pain, unspecified: Secondary | ICD-10-CM | POA: Insufficient documentation

## 2024-08-28 DIAGNOSIS — R109 Unspecified abdominal pain: Secondary | ICD-10-CM | POA: Diagnosis not present

## 2024-08-28 DIAGNOSIS — D72829 Elevated white blood cell count, unspecified: Secondary | ICD-10-CM | POA: Diagnosis not present

## 2024-08-28 HISTORY — DX: Type 2 diabetes mellitus without complications: E11.9

## 2024-08-28 LAB — CBC
HCT: 44.7 % (ref 39.0–52.0)
Hemoglobin: 15.6 g/dL (ref 13.0–17.0)
MCH: 31.1 pg (ref 26.0–34.0)
MCHC: 34.9 g/dL (ref 30.0–36.0)
MCV: 89 fL (ref 80.0–100.0)
Platelets: 309 K/uL (ref 150–400)
RBC: 5.02 MIL/uL (ref 4.22–5.81)
RDW: 13.1 % (ref 11.5–15.5)
WBC: 11.6 K/uL — ABNORMAL HIGH (ref 4.0–10.5)
nRBC: 0 % (ref 0.0–0.2)

## 2024-08-28 LAB — COMPREHENSIVE METABOLIC PANEL WITH GFR
ALT: 16 U/L (ref 0–44)
AST: 23 U/L (ref 15–41)
Albumin: 4.6 g/dL (ref 3.5–5.0)
Alkaline Phosphatase: 93 U/L (ref 38–126)
Anion gap: 13 (ref 5–15)
BUN: 14 mg/dL (ref 6–20)
CO2: 25 mmol/L (ref 22–32)
Calcium: 9.5 mg/dL (ref 8.9–10.3)
Chloride: 99 mmol/L (ref 98–111)
Creatinine, Ser: 1.07 mg/dL (ref 0.61–1.24)
GFR, Estimated: >60 mL/min
Glucose, Bld: 138 mg/dL — ABNORMAL HIGH (ref 70–99)
Potassium: 4.4 mmol/L (ref 3.5–5.1)
Sodium: 137 mmol/L (ref 135–145)
Total Bilirubin: 0.3 mg/dL (ref 0.0–1.2)
Total Protein: 8 g/dL (ref 6.5–8.1)

## 2024-08-28 LAB — URINALYSIS, ROUTINE W REFLEX MICROSCOPIC
Bilirubin Urine: NEGATIVE
Glucose, UA: NEGATIVE mg/dL
Hgb urine dipstick: NEGATIVE
Ketones, ur: NEGATIVE mg/dL
Leukocytes,Ua: NEGATIVE
Nitrite: NEGATIVE
Protein, ur: NEGATIVE mg/dL
Specific Gravity, Urine: 1.02 (ref 1.005–1.030)
pH: 7 (ref 5.0–8.0)

## 2024-08-28 LAB — LIPASE, BLOOD: Lipase: 18 U/L (ref 11–51)

## 2024-08-28 MED ORDER — IOHEXOL 350 MG/ML SOLN
75.0000 mL | Freq: Once | INTRAVENOUS | Status: AC | PRN
  Administered 2024-08-28: 75 mL via INTRAVENOUS

## 2024-08-28 NOTE — ED Triage Notes (Signed)
 Pt. Stated, Jesus had left middle area in my stomach pain for 3 weeks. I started with diarrhea and then got constipated with really dark stool.

## 2024-08-28 NOTE — ED Provider Triage Note (Signed)
 Emergency Medicine Provider Triage Evaluation Note  Gaurav Baldree , a 58 y.o. male  was evaluated in triage.  Pt complains of abdominal pain dark stool but has been using Pepto-Bismol pretty much daily.  Sometimes diarrhea but somewhat cramping abdominal pain throughout.  No recent antibiotic use, not on blood thinners  Review of Systems  Positive: Abdominal pain dark stools Negative: Shortness of breath  Physical Exam  BP (!) 164/94   Pulse (!) 114   Resp 16   Ht 6' 4 (1.93 m)   Wt 99.8 kg   SpO2 98%   BMI 26.78 kg/m  Gen:   Awake, no distress   Resp:  Normal effort  MSK:   Moves extremities without difficulty  Other:  In his abdomen  Medical Decision Making  Medically screening exam initiated at 11:26 AM.  Appropriate orders placed.  Izaias Krupka was informed that the remainder of the evaluation will be completed by another provider, this initial triage assessment does not replace that evaluation, and the importance of remaining in the ED until their evaluation is complete.     Ruthe Cornet, DO 08/28/24 1127

## 2024-08-28 NOTE — ED Provider Notes (Signed)
 " [facility] Provider Note   CSN: [id number] Arrival date & time: 08/28/24  9078     Patient presents with: Abdominal Pain and Blood In Stools   John Harrington is a 58 y.o. male.  Who presents to the ED for abdominal pain and dark stools.  Lower abdominal pain and cramping for the last 2 to 3 weeks suspected to be viral illness.  Has been taking Advil  and Pepto-Bismol throughout the day without much improvement.  Reported dark stools and diarrhea.  Was seen at urgent care and sent here for further evaluation.  No fevers chills respiratory symptoms.  No prior history of GI bleeding    Abdominal Pain      Prior to Admission medications  Medication Sig Start Date End Date Taking? Authorizing Provider  albuterol  (VENTOLIN  HFA) 108 (90 Base) MCG/ACT inhaler Inhale 2 puffs into the lungs every 4 (four) hours as needed for wheezing or shortness of breath. 08/25/21   White, Shelba SAUNDERS, NP  azelastine  (OPTIVAR ) 0.05 % ophthalmic solution Place 1 drop into both eyes 2 (two) times daily. Patient not taking: Reported on 08/07/2015 01/10/13   Moreno-Coll, Adlih, MD  ciclopirox  (LOPROX ) 0.77 % cream Apply topically 2 (two) times daily. Patient not taking: Reported on 08/28/2024 04/05/16   Bryn Bernardino NOVAK, MD  erythromycin  ophthalmic ointment Place a 1/2 inch ribbon of ointment into the lower conjunctiva bilateral tid for 7 days. Patient not taking: Reported on 08/07/2015 01/10/13   Moreno-Coll, Adlih, MD  fexofenadine  (ALLEGRA ) 180 MG tablet Take 1 tablet (180 mg total) by mouth daily as needed. Patient not taking: Reported on 08/07/2015 01/10/13   Moreno-Coll, Adlih, MD  fluticasone  (FLONASE ) 50 MCG/ACT nasal spray Place 2 sprays into the nose daily. Patient not taking: Reported on 08/07/2015 01/10/13   Moreno-Coll, Adlih, MD  guaiFENesin -codeine  (ROBITUSSIN AC) 100-10 MG/5ML syrup Take 5 mLs by mouth 3 (three) times daily as needed for cough. Patient not taking:  Reported on 08/07/2015 01/10/13   Moreno-Coll, Adlih, MD  hydrocortisone  2.5 % cream Apply topically 2 (two) times daily. 04/05/16   Bryn Bernardino NOVAK, MD  ibuprofen  (ADVIL ,MOTRIN ) 800 MG tablet Take 1 tablet (800 mg total) by mouth 3 (three) times daily. 08/07/15   Rose, Kayla, PA-C  lisinopril  (ZESTRIL ) 5 MG tablet Take 1 tablet (5 mg total) by mouth daily. Patient not taking: Reported on 08/28/2024 08/25/21   Teresa Shelba SAUNDERS, NP  METFORMIN HCL PO Take by mouth.    [provider]  OVER THE COUNTER MEDICATION Patient has been off medicine for 6 months, ran out    [provider]  OVER THE COUNTER MEDICATION Patient took blood pressure medicine this morning.  Patient cannot state the name of medicine, but says it begins with an O    [provider]  predniSONE  (STERAPRED UNI-PAK 21 TAB) 10 MG (21) TBPK tablet Take by mouth daily. Take 6 tabs by mouth daily  for 2 days, then 5 tabs for 2 days, then 4 tabs for 2 days, then 3 tabs for 2 days, 2 tabs for 2 days, then 1 tab by mouth daily for 2 days Patient not taking: Reported on 08/28/2024 08/25/21   Teresa Shelba SAUNDERS, NP  triamterene -hydrochlorothiazide (MAXZIDE-25) 37.5-25 MG per tablet Take 1 tablet by mouth daily. Patient not taking: Reported on 08/07/2015 01/10/13   Moreno-Coll, Kathlene, MD    Allergies: Patient has no known allergies.    Review of Systems  Gastrointestinal:  Positive for abdominal pain.    Updated Vital Signs BP (!) 146/93 (BP Location: Right Arm)   Pulse 93   Temp 98.5 F (36.9 C)   Resp 18   Ht 6' 4 (1.93 m)   Wt 99.8 kg   SpO2 97%   BMI 26.78 kg/m   Physical Exam Vitals and nursing note reviewed.  HENT:     Head: Normocephalic and atraumatic.  Eyes:     Pupils: Pupils are equal, round, and reactive to light.  Cardiovascular:     Rate and Rhythm: Normal rate and regular rhythm.  Pulmonary:     Effort: Pulmonary effort is normal.     Breath sounds: Normal breath sounds.  Abdominal:      Palpations: Abdomen is soft.     Tenderness: There is no abdominal tenderness. There is no guarding or rebound.  Skin:    General: Skin is warm and dry.  Neurological:     Mental Status: He is alert.  Psychiatric:        Mood and Affect: Mood normal.     (all labs ordered are listed, but only abnormal results are displayed) Labs Reviewed  COMPREHENSIVE METABOLIC PANEL WITH GFR - Abnormal; Notable for the following components:      Result Value   Glucose, Bld 138 (*)    All other components within normal limits  CBC - Abnormal; Notable for the following components:   WBC 11.6 (*)    All other components within normal limits  LIPASE, BLOOD  URINALYSIS, ROUTINE W REFLEX MICROSCOPIC  POC OCCULT BLOOD, ED    EKG: None  Radiology: CT ABDOMEN PELVIS W CONTRAST Result Date: 08/28/2024 EXAM: CT ABDOMEN AND PELVIS WITH CONTRAST 08/28/2024 05:21:02 PM TECHNIQUE: CT of the abdomen and pelvis was performed with the administration of 75 mL of iohexol  (OMNIPAQUE ) 350 MG/ML injection. Multiplanar reformatted images are provided for review. Automated exposure control, iterative reconstruction, and/or weight-based adjustment of the mA/kV was utilized to reduce the radiation dose to as low as reasonably achievable. COMPARISON: None available. CLINICAL HISTORY: Abdominal pain, acute, nonlocalized. FINDINGS: LOWER CHEST: Extensive multivessel coronary artery calcification. LIVER: The liver is unremarkable. GALLBLADDER AND BILE DUCTS: Gallbladder is unremarkable. No biliary ductal dilatation. SPLEEN: No acute abnormality. PANCREAS: No acute abnormality. ADRENAL GLANDS: No acute abnormality. KIDNEYS, URETERS AND BLADDER: No stones in the kidneys or ureters. No hydronephrosis. No perinephric or periureteral stranding. Urinary bladder is unremarkable. GI AND BOWEL: Appendix normal. The stomach, small bowel, and large bowel are otherwise unremarkable. There is no bowel obstruction. PERITONEUM AND  RETROPERITONEUM: No ascites. No free air. VASCULATURE: Aorta is normal in caliber. Mild aortoiliac atherosclerotic calcification. No aortic aneurysm. LYMPH NODES: There is pathologic right external iliac lymphadenopathy (series 77, image 2) with the isolated enlarged lymph node measuring 16 mm in short axis diameter. This is nonspecific. Correlation with serum PSA examination and possible PET CT examination may be helpful for further evaluation. REPRODUCTIVE ORGANS: No acute abnormality. BONES AND SOFT TISSUES: Osseous structures are age appropriate. No acute bone abnormality. No lytic or blastic bone lesion. No focal soft tissue abnormality. IMPRESSION: 1. No acute findings in the abdomen or pelvis. 2. Pathologic right external iliac lymphadenopathy, nonspecific. Recommend serum PSA evaluation and consider PET/CT for further characterization. 3. Extensive multivessel coronary artery calcification. 4. Raf score includes aortic atherosclerosis (ICD10-I70.0). Electronically signed by: Dorethia Molt MD 08/28/2024 06:40 PM EST RP Workstation: [id number]     Procedures   Medications Ordered in the ED  iohexol  (OMNIPAQUE ) 350 MG/ML injection 75 mL (75 mLs Intravenous Contrast Given 08/28/24 1721)                                    Medical Decision Making 58 year old male with history as above presenting for abdominal pain dark stools.  2 to 3 weeks of with some leg early viral symptoms.  Some diarrhea recently.  Taking Pepto-Bismol throughout the day.  Laboratory workup is unremarkable CT abdomen pelvis shows right external iliac lymphadenopathy and no other acute findings.  I informed the patient and his family member at bedside of these findings and need for follow-up with PCP.  Low suspicion for acute GI bleeding more likely due to to known side effect of Pepto-Bismol with dark stools.  Appropriate discharge with close PCP follow-up  Amount and/or Complexity of Data Reviewed Labs: ordered.         Final diagnoses:  Abdominal pain, unspecified abdominal location  Dark stools    ED Discharge Orders     None          Pamella Ozell LABOR, DO 08/28/24 2254  "

## 2024-08-28 NOTE — Discharge Instructions (Signed)
 Please head to [facility] Emergency Department for further evaluation of your abdominal pain and dark stools.

## 2024-08-28 NOTE — Discharge Instructions (Signed)
 You were seen in the ER for abdominal pain and dark stool There were no abnormalities in your blood work The CT scan of your abdomen showed enlarged lymph nodes which may be the result of your recent viral illness but should be followed up with your new primary doctor Pepto bismol can cause dark stools Return to the ER for severe pain

## 2024-08-28 NOTE — ED Provider Notes (Signed)
 " [facility]    CSN: [id number] Arrival date & time: 08/28/24  9195      History   Chief Complaint Chief Complaint  Patient presents with   Abdominal Pain    HPI John Harrington is a 58 y.o. male.   Patient presents to clinic over concern of abdominal pain and dark stools.  Patient has been having lower abdominal pain and cramping for the past 2-3 weeks, thought it was a viral illness at first Has been taking pepto bismol and advil  1-2x daily without much improvement Noticed dark / black stool for the past two weeks  When he first noticed the dark stool he had diarrhea, this past week his stool has been dark, small stools  No pain with bowel movements  Denies N/V or fevers  Denies dizziness or syncope   Last had dark stool this morning    The history is provided by the patient and medical records.  Abdominal Pain   Past Medical History:  Diagnosis Date   Diabetes mellitus without complication (HCC)    Hypertension    Legally blind     Patient Active Problem List   Diagnosis Date Noted   Essential hypertension, benign 08/07/2015   Diabetes mellitus without complication (HCC) 08/07/2015   Tobacco use disorder 08/07/2015    History reviewed. No pertinent surgical history.     Home Medications    Prior to Admission medications  Medication Sig Start Date End Date Taking? Authorizing Provider  albuterol  (VENTOLIN  HFA) 108 (90 Base) MCG/ACT inhaler Inhale 2 puffs into the lungs every 4 (four) hours as needed for wheezing or shortness of breath. 08/25/21   White, Shelba SAUNDERS, NP  azelastine  (OPTIVAR ) 0.05 % ophthalmic solution Place 1 drop into both eyes 2 (two) times daily. Patient not taking: Reported on 08/07/2015 01/10/13   Moreno-Coll, Adlih, MD  ciclopirox  (LOPROX ) 0.77 % cream Apply topically 2 (two) times daily. Patient not taking: Reported on 08/28/2024 04/05/16   Bryn Bernardino NOVAK, MD  erythromycin  ophthalmic ointment Place a 1/2 inch ribbon of  ointment into the lower conjunctiva bilateral tid for 7 days. Patient not taking: Reported on 08/07/2015 01/10/13   Moreno-Coll, Adlih, MD  fexofenadine  (ALLEGRA ) 180 MG tablet Take 1 tablet (180 mg total) by mouth daily as needed. Patient not taking: Reported on 08/07/2015 01/10/13   Moreno-Coll, Adlih, MD  fluticasone  (FLONASE ) 50 MCG/ACT nasal spray Place 2 sprays into the nose daily. Patient not taking: Reported on 08/07/2015 01/10/13   Moreno-Coll, Adlih, MD  guaiFENesin -codeine  (ROBITUSSIN AC) 100-10 MG/5ML syrup Take 5 mLs by mouth 3 (three) times daily as needed for cough. Patient not taking: Reported on 08/07/2015 01/10/13   Moreno-Coll, Adlih, MD  hydrocortisone  2.5 % cream Apply topically 2 (two) times daily. 04/05/16   Bryn Bernardino NOVAK, MD  ibuprofen  (ADVIL ,MOTRIN ) 800 MG tablet Take 1 tablet (800 mg total) by mouth 3 (three) times daily. 08/07/15   Rose, Kayla, PA-C  lisinopril  (ZESTRIL ) 5 MG tablet Take 1 tablet (5 mg total) by mouth daily. Patient not taking: Reported on 08/28/2024 08/25/21   Teresa Shelba SAUNDERS, NP  METFORMIN HCL PO Take by mouth.    [provider]  OVER THE COUNTER MEDICATION Patient has been off medicine for 6 months, ran out    [provider]  OVER THE COUNTER MEDICATION Patient took blood pressure medicine this morning.  Patient cannot state the name of medicine, but says it begins with an O    [provider]  predniSONE  (STERAPRED UNI-PAK 21 TAB) 10 MG (21) TBPK tablet Take by mouth daily. Take 6 tabs by mouth daily  for 2 days, then 5 tabs for 2 days, then 4 tabs for 2 days, then 3 tabs for 2 days, 2 tabs for 2 days, then 1 tab by mouth daily for 2 days Patient not taking: Reported on 08/28/2024 08/25/21   Teresa Shelba SAUNDERS, NP  triamterene -hydrochlorothiazide (MAXZIDE-25) 37.5-25 MG per tablet Take 1 tablet by mouth daily. Patient not taking: Reported on 08/07/2015 01/10/13   Moreno-Coll, Kathlene, MD    Family History Family History  Family  history unknown: Yes    Social History Social History[1]   Allergies   Patient has no known allergies.   Review of Systems Review of Systems  Per HPI  Physical Exam Triage Vital Signs ED Triage Vitals [08/28/24 0821]  Encounter Vitals Group     BP (!) 188/119     Girls Systolic BP Percentile      Girls Diastolic BP Percentile      Boys Systolic BP Percentile      Boys Diastolic BP Percentile      Pulse Rate (!) 104     Resp 16     Temp 98.4 F (36.9 C)     Temp Source Oral     SpO2 95 %     Weight      Height      Head Circumference      Peak Flow      Pain Score 6     Pain Loc      Pain Education      Exclude from Growth Chart    No data found.  Updated Vital Signs BP (!) 188/119 (BP Location: Right Arm) Comment: Repeata BP- 185/113. Patient states he has been out of his BP meds x 2 years.  Pulse (!) 104   Temp 98.4 F (36.9 C) (Oral)   Resp 16   SpO2 95%   Visual Acuity Right Eye Distance:   Left Eye Distance:   Bilateral Distance:    Right Eye Near:   Left Eye Near:    Bilateral Near:     Physical Exam Vitals and nursing note reviewed.  Constitutional:      Appearance: Normal appearance. He is well-developed.  HENT:     Head: Normocephalic and atraumatic.     Right Ear: External ear normal.     Left Ear: External ear normal.     Nose: Nose normal.     Mouth/Throat:     Mouth: Mucous membranes are moist.  Eyes:     Conjunctiva/sclera: Conjunctivae normal.  Cardiovascular:     Rate and Rhythm: Normal rate.  Pulmonary:     Effort: Pulmonary effort is normal. No respiratory distress.  Abdominal:     General: Abdomen is flat. Bowel sounds are normal.     Palpations: Abdomen is soft.     Tenderness: There is abdominal tenderness in the left lower quadrant. There is no guarding or rebound.     Hernia: No hernia is present.  Skin:    General: Skin is warm and dry.  Neurological:     General: No focal deficit present.     Mental Status:  He is alert and oriented to person, place, and time.  Psychiatric:        Mood and Affect: Mood normal.        Behavior: Behavior normal.      UC  Treatments / Results  Labs (all labs ordered are listed, but only abnormal results are displayed) Labs Reviewed - No data to display  EKG   Radiology No results found.  Procedures Procedures (including critical care time)  Medications Ordered in UC Medications - No data to display  Initial Impression / Assessment and Plan / UC Course  I have reviewed the triage vital signs and the nursing notes.  Pertinent labs & imaging results that were available during my care of the patient were reviewed by me and considered in my medical decision making (see chart for details).  Vitals and triage reviewed, patient is hemodynamically stable.  Abdomen is soft with left lower quadrant tenderness, without rebound or guarding.  Patient is hypertensive, not currently under care of primary care provider or taking antihypertensives.  Last dark stool was this morning.  Discussed limitations of urgent care evaluation.  Offered POC occult stool testing and labs but discussed we would not be able to find the source of his potential GI bleed without CT scan/advanced imaging.  Patient verbalized understanding and would like further evaluation at nearest emergency department.  Patient will transport via POV.    Final Clinical Impressions(s) / UC Diagnoses   Final diagnoses:  Abdominal pain, unspecified abdominal location  Dark stools     Discharge Instructions      Please head to [facility] Emergency Department for further evaluation of your abdominal pain and dark stools.      ED Prescriptions   None    PDMP not reviewed this encounter.     [1]  Social History Tobacco Use   Smoking status: Every Day    Current packs/day: 1.00    Types: Cigarettes   Smokeless tobacco: Never  Vaping Use   Vaping status: Never Used  Substance  Use Topics   Alcohol use: Yes   Drug use: Never     Dreama Johanne SAILOR, FNP 08/28/24 0902  "

## 2024-08-28 NOTE — ED Triage Notes (Signed)
 PT arrives via POV. Reports intermittent abdominal pain and black stool for the the past 2 weeks. No blood thinners. Pt is AxOx4.

## 2024-08-28 NOTE — ED Triage Notes (Signed)
 Patient reports that he has had intermittent left abdominal pain x 2 weeks and constant for the past week. Patient reports that he has been having a black stool x 2 weeks and the last this AM.  Patient denies N/V/D.  Patient states he has been taking Ibuprofen  for his symptoms.
